# Patient Record
Sex: Female | Born: 1990 | Race: Black or African American | Hispanic: No | Marital: Single
Health system: Midwestern US, Community
[De-identification: ages and names within clinical notes are randomized; demographics above are authoritative.]

---
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
## 2011-08-12 NOTE — ED Notes (Signed)
Bedside report and handoff received from M. Freida Busman, RN

## 2011-08-12 NOTE — ED Notes (Addendum)
Triage Note: Pt with vaginal pain which started about 3 days ago. Pt stated the pain started after sex. Pt with discharge and itching. No pain with urination. Pt stated she also has swelling to the area.

## 2011-08-12 NOTE — ED Provider Notes (Signed)
HPI Comments: 21 y.o.female with history of asthma who presents to the ED with complaints of vaginal pain and discharge beginning 3 days ago and worsening since onset. Pain is constant, aching, pruritic, nonradiating, moderate to severe and not exacerbated with urination. Vagicil used 2-3 times per day for the past 2 days without relief. Patient was diagnosed and treated for chlamydia 2 months ago at [facility]. Denies possibility of recurrence. Denies any HA, fever, chills, cough, congestion, chest pain, dyspnea, abdominal pain, nausea, vomiting, diarrhea, bloody stool, dysuria, hematuria, flank pain, vaginal bleeding, numbness, rash or appetite change. Here with boyfriend. He was not partner 2 mo ago.     Surgical Hx: none  Social Hx: +BC, LMP in past month was normal,     Note written by Shara Blazing, Scribe, as dictated by Danford Bad, MD 12:46 AM          The history is provided by the patient.        Past Medical History   Diagnosis Date   ??? Asthma         History reviewed. No pertinent past surgical history.      History reviewed. No pertinent family history.     History     Social History   ??? Marital Status: SINGLE     Spouse Name: N/A     Number of Children: N/A   ??? Years of Education: N/A     Occupational History   ??? Not on file.     Social History Main Topics   ??? Smoking status: Not on file   ??? Smokeless tobacco: Not on file   ??? Alcohol Use: Yes   ??? Drug Use:    ??? Sexually Active:      Other Topics Concern   ??? Not on file     Social History Narrative   ??? No narrative on file                  ALLERGIES: Amoxicillin      Review of Systems   Constitutional: Negative for fever, chills, diaphoresis and appetite change.   HENT: Negative for congestion and rhinorrhea.    Respiratory: Negative for cough.    Gastrointestinal: Negative for nausea, vomiting, abdominal pain and diarrhea.   Genitourinary: Positive for vaginal discharge and vaginal pain. Negative for dysuria, frequency, flank pain, decreased  urine volume, vaginal bleeding, menstrual problem and pelvic pain.   Skin: Negative for rash.   Neurological: Negative for weakness, light-headedness and headaches.   All other systems reviewed and are negative.        Filed Vitals:    08/12/11 2221   BP: 124/95   Pulse: 90   Temp: 98.2 ??F (36.8 ??C)   Resp: 18   Weight: 51.3 kg (113 lb 1.5 oz)   SpO2: 100%            Physical Exam   Nursing note and vitals reviewed.  Constitutional: She is oriented to person, place, and time. She appears well-developed and well-nourished. No distress.        Pt comfortable in bed   HENT:   Head: Normocephalic and atraumatic.   Mouth/Throat: Oropharynx is clear and moist. No oropharyngeal exudate.   Eyes: Conjunctivae are normal. No scleral icterus.   Neck: Neck supple. No JVD present.   Cardiovascular: Normal rate, regular rhythm, normal heart sounds and intact distal pulses.    Pulmonary/Chest: Effort normal and breath sounds normal. No respiratory  distress. She has no wheezes. She exhibits no tenderness.   Abdominal: Soft. She exhibits no distension. There is no tenderness. There is no rebound and no guarding.   Genitourinary:        + yellowish/green vaginal discharge, cervix slightly erythematous. No CMT. No external lesions.    Musculoskeletal: Normal range of motion.   Lymphadenopathy:     She has no cervical adenopathy.   Neurological: She is alert and oriented to person, place, and time. No cranial nerve deficit. Coordination normal.   Skin: Skin is warm.   Psychiatric: She has a normal mood and affect.        MDM     Differential Diagnosis; Clinical Impression; Plan:     21 yo with + vaginal discharge, 2 mo had + chlamydia- appears c/w same infection again. UA + for signs of UTI vs vaginal discharge in urine. Culture pending. Treated for STD with CTX and azithro. Will give course fo keflex for UTI. F/u with PMD  Amount and/or Complexity of Data Reviewed:   Clinical lab tests:  Ordered and reviewed   Obtain history from  someone other than the patient:  No  Risk of Significant Complications, Morbidity, and/or Mortality:   Presenting problems:  Moderate  Management options:  Moderate  Progress:   Patient progress:  Improved      Procedures

## 2011-08-13 LAB — KOH, OTHER SOURCES: KOH: NONE SEEN

## 2011-08-13 LAB — URINALYSIS W/ REFLEX CULTURE
Bacteria: NEGATIVE /HPF
Bilirubin: NEGATIVE
Glucose: NEGATIVE MG/DL
Ketone: NEGATIVE MG/DL
Nitrites: NEGATIVE
Protein: NEGATIVE MG/DL
Specific gravity: 1.011 (ref 1.003–1.030)
Urobilinogen: 1 EU/DL (ref 0.2–1.0)
pH (UA): 6.5 (ref 5.0–8.0)

## 2011-08-13 LAB — HCG URINE, QL. - POC: Pregnancy test,urine (POC): NEGATIVE

## 2011-08-13 LAB — CHLAMYDIA/GC PCR
Chlamydia amplified: NEGATIVE
N. gonorrhea, amplified: NEGATIVE

## 2011-08-13 LAB — WET PREP
Clue cells: ABSENT
Wet prep: NONE SEEN

## 2011-08-13 MED ORDER — TRIMETHOPRIM-SULFAMETHOXAZOLE 160 MG-800 MG TAB
160-800 mg | ORAL_TABLET | Freq: Two times a day (BID) | ORAL | Status: AC
Start: 2011-08-13 — End: 2011-08-16

## 2011-08-13 MED ADMIN — cefTRIAXone (ROCEPHIN) 250 mg in lidocaine (PF) (XYLOCAINE) 10 mg/mL (1 %) IM injection: INTRAMUSCULAR | @ 06:00:00 | NDC 63323049257

## 2011-08-13 MED ADMIN — ondansetron (ZOFRAN ODT) tablet 4 mg: ORAL | @ 06:00:00 | NDC 68462015740

## 2011-08-13 MED ADMIN — azithromycin (ZITHROMAX) tablet 1,000 mg: ORAL | @ 06:00:00 | NDC 50268009815

## 2011-08-13 MED FILL — CEFTRIAXONE 250 MG SOLUTION FOR INJECTION: 250 mg | INTRAMUSCULAR | Qty: 250

## 2011-08-13 MED FILL — ONDANSETRON 4 MG TAB, RAPID DISSOLVE: 4 mg | ORAL | Qty: 1

## 2011-08-13 MED FILL — AZITHROMYCIN 250 MG TAB: 250 mg | ORAL | Qty: 4

## 2011-08-13 NOTE — ED Notes (Signed)
Pt up to restroom without difficulty

## 2011-08-13 NOTE — ED Notes (Signed)
Pt tolerated pelvic exam without difficulty. Respirations regular. Skin pink, dry and warm. Abdomen soft and non tender. Pt stated she continues with slight vaginal pain at this time. Friend at bedside.

## 2011-08-13 NOTE — ED Notes (Signed)
Pt without swelling or discomfort at injection site. Respirations regular. Skin pink, dry and warm. Abdomen soft and nontender.

## 2011-08-13 NOTE — ED Notes (Signed)
DC instructions given by MD, pt verbalized understanding, no questions or concerns at this time. Pt ambulated out of department without difficulty, no pain noted at this time.

## 2011-08-14 LAB — CULTURE, URINE
Colonies Counted: <1000
Colony Count: <1000
Culture result:: NO GROWTH
Culture: NO GROWTH

## 2017-12-28 ENCOUNTER — Emergency Department (HOSPITAL_BASED_OUTPATIENT_CLINIC_OR_DEPARTMENT_OTHER)
Admission: EM | Admit: 2017-12-28 | Discharge: 2017-12-28 | Disposition: A | Discharge status: Home / Self Care | Payer: No Typology Code available for payment source | Attending: Emergency Medicine | Admitting: Emergency Medicine

## 2017-12-28 ENCOUNTER — Other Ambulatory Visit: Payer: Self-pay

## 2017-12-28 ENCOUNTER — Encounter (HOSPITAL_BASED_OUTPATIENT_CLINIC_OR_DEPARTMENT_OTHER): Payer: Self-pay | Admitting: Emergency Medicine

## 2017-12-28 ENCOUNTER — Emergency Department (HOSPITAL_BASED_OUTPATIENT_CLINIC_OR_DEPARTMENT_OTHER): Payer: No Typology Code available for payment source

## 2017-12-28 DIAGNOSIS — F1210 Cannabis abuse, uncomplicated: Secondary | ICD-10-CM | POA: Diagnosis not present

## 2017-12-28 DIAGNOSIS — R1032 Left lower quadrant pain: Secondary | ICD-10-CM | POA: Diagnosis present

## 2017-12-28 DIAGNOSIS — R110 Nausea: Secondary | ICD-10-CM | POA: Diagnosis not present

## 2017-12-28 DIAGNOSIS — N83201 Unspecified ovarian cyst, right side: Secondary | ICD-10-CM | POA: Diagnosis not present

## 2017-12-28 DIAGNOSIS — N739 Female pelvic inflammatory disease, unspecified: Secondary | ICD-10-CM | POA: Diagnosis not present

## 2017-12-28 LAB — URINALYSIS, ROUTINE W REFLEX MICROSCOPIC
BILIRUBIN URINE: NEGATIVE
Glucose, UA: NEGATIVE mg/dL
KETONES UR: NEGATIVE mg/dL
Nitrite: NEGATIVE
PH: 7 (ref 5.0–8.0)
Protein, ur: NEGATIVE mg/dL
Specific Gravity, Urine: 1.01 (ref 1.005–1.030)

## 2017-12-28 LAB — WET PREP, GENITAL
Clue Cells Wet Prep HPF POC: NONE SEEN
SPERM: NONE SEEN
Trich, Wet Prep: NONE SEEN
YEAST WET PREP: NONE SEEN

## 2017-12-28 LAB — URINALYSIS, MICROSCOPIC (REFLEX): RBC / HPF: NONE SEEN RBC/hpf (ref 0–5)

## 2017-12-28 LAB — PREGNANCY, URINE: PREG TEST UR: NEGATIVE

## 2017-12-28 MED ORDER — LIDOCAINE HCL (PF) 1 % IJ SOLN
INTRAMUSCULAR | Status: AC
  Filled 2017-12-28: qty 5

## 2017-12-28 MED ORDER — DOXYCYCLINE HYCLATE 100 MG PO CAPS: 100.0000 mg | ORAL_CAPSULE | Freq: Two times a day (BID) | ORAL | 0 refills | Status: AC

## 2017-12-28 MED ORDER — MELOXICAM 7.5 MG PO TABS: 7.5000 mg | ORAL_TABLET | Freq: Every day | ORAL | 0 refills | Status: AC

## 2017-12-28 MED ORDER — CEFTRIAXONE SODIUM 250 MG IJ SOLR
250.0000 mg | Freq: Once | INTRAMUSCULAR | Status: AC
  Administered 2017-12-28: 250 mg via INTRAMUSCULAR
  Filled 2017-12-28: qty 250

## 2017-12-28 MED ORDER — DOXYCYCLINE HYCLATE 100 MG PO TABS
100.0000 mg | ORAL_TABLET | Freq: Once | ORAL | Status: AC
  Administered 2017-12-28: 100 mg via ORAL
  Filled 2017-12-28: qty 1

## 2017-12-28 MED ORDER — FLUCONAZOLE 150 MG PO TABS: 150.0000 mg | ORAL_TABLET | Freq: Once | ORAL | 0 refills | Status: AC

## 2017-12-28 MED ORDER — MELOXICAM 7.5 MG PO TABS
7.5000 mg | ORAL_TABLET | Freq: Once | ORAL | Status: AC
  Administered 2017-12-28: 7.5 mg via ORAL
  Filled 2017-12-28: qty 1

## 2017-12-28 NOTE — Discharge Instructions (Signed)
Please read the instructions below. Talk with your Gynecologist about your ultrasound results. Finish your antibiotic as prescribed. You will receive a call from the hospital if your test results come back positive. Avoid sexual activity until you know your test results. If your results come back positive, it is important that you inform all of your sexual partners. Return to the ER for new or worsening symptoms.

## 2017-12-28 NOTE — ED Provider Notes (Addendum)
[facility] EMERGENCY DEPARTMENT Provider Note   CSN: [id number] Arrival date & time: 12/28/17  1606     History   Chief Complaint Chief Complaint  Patient presents with  . Abdominal Pain    HPI Lynn Stanley is a 27 y.o. female with PMHx recurrent BV, presenting to the ED with complaint of intermittent chronic LLQ abdominal pain. Pain has been coming and going for "a while", occurs 1-2 times per month. Pain is described as dull and aching, with intermittent sharp pains. This episode began yesterday. She has treated with ibuprofen and mobic, mobic provided imrovement however she left her Rx at home and lives in [place]. (she is here for work). She has assoc intermittent nausea. Denies vomiting, diarrhea, constipation, fever, urinary sx. States she has issues with chronic BV, has some vaginal discharge, not more than normal. Was recently being treated by her Gyn for prolonged vaginal bleeding after her period. Had an IUD placed and then started taking a "pill" on Wednesday. Bleeding resolved. She went to urgent care prior to arrival who sent her here for further evaluation. No hx abdominal surgery. Not currently sexually active.   The history is provided by the patient.    History reviewed. No pertinent past medical history.  There are no active problems to display for this patient.   History reviewed. No pertinent surgical history.   OB History   None      Home Medications    Prior to Admission medications   Medication Sig Start Date End Date Taking? Authorizing Provider  doxycycline (VIBRAMYCIN) 100 MG capsule Take 1 capsule (100 mg total) by mouth 2 (two) times daily for 14 days. 12/28/17 01/11/18  Quang Thorpe, [place] N, PA-C  fluconazole (DIFLUCAN) 150 MG tablet Take 1 tablet (150 mg total) by mouth once for 1 dose. 12/28/17 12/28/17  Charmion Hapke, [place] N, PA-C  meloxicam (MOBIC) 7.5 MG tablet Take 1 tablet (7.5 mg total) by mouth daily for 10 days. 12/28/17 01/07/18   Tyheim Vanalstyne, [place] N, PA-C    Family History No family history on file.  Social History Social History   Tobacco Use  . Smoking status: Never Smoker  . Smokeless tobacco: Never Used  Substance Use Topics  . Alcohol use: Yes  . Drug use: Yes    Types: Marijuana     Allergies   Amoxicillin   Review of Systems Review of Systems  Constitutional: Negative for fever.  Gastrointestinal: Positive for abdominal pain and nausea. Negative for constipation, diarrhea and vomiting.  Genitourinary: Positive for vaginal discharge. Negative for dysuria, frequency and vaginal bleeding.  All other systems reviewed and are negative.    Physical Exam Updated Vital Signs BP 119/76 (BP Location: Right Arm)   Pulse 71   Temp 98.5 F (36.9 C) (Oral)   Resp 18   Ht 5\' 5"  (1.651 m)   Wt 54.4 kg   SpO2 100%   BMI 19.97 kg/m   Physical Exam  Constitutional: She appears well-developed and well-nourished.  Non-toxic appearance. She does not appear ill. No distress.  HENT:  Head: Normocephalic and atraumatic.  Eyes: Conjunctivae are normal.  Cardiovascular: Normal rate, regular rhythm and normal heart sounds.  Pulmonary/Chest: Effort normal and breath sounds normal. No respiratory distress.  Abdominal: Soft. Bowel sounds are normal. She exhibits no distension and no mass. There is tenderness (left lower quadrant). There is no rebound and no guarding.  Genitourinary: There is no rash or tenderness on the right labia. There is no rash  or tenderness on the left labia. Cervix exhibits motion tenderness and discharge. Right adnexum displays tenderness. Left adnexum displays tenderness. No tenderness in the vagina.  Genitourinary Comments: Exam performed with female RN chaperone present.  Neurological: She is alert.  Skin: Skin is warm.  Psychiatric: She has a normal mood and affect. Her behavior is normal.  Nursing note and vitals reviewed.    ED Treatments / Results  Labs (all labs ordered  are listed, but only abnormal results are displayed) Labs Reviewed  WET PREP, GENITAL - Abnormal; Notable for the following components:      Result Value   WBC, Wet Prep HPF POC MANY (*)    All other components within normal limits  URINALYSIS, ROUTINE W REFLEX MICROSCOPIC - Abnormal; Notable for the following components:   Hgb urine dipstick TRACE (*)    Leukocytes, UA MODERATE (*)    All other components within normal limits  URINALYSIS, MICROSCOPIC (REFLEX) - Abnormal; Notable for the following components:   Bacteria, UA RARE (*)    All other components within normal limits  PREGNANCY, URINE  HIV ANTIBODY (ROUTINE TESTING W REFLEX)  RPR  GC/CHLAMYDIA PROBE AMP ([place]) NOT AT [facility]    EKG None  Radiology US Transvaginal Non-ob  Result Date: 12/28/2017 CLINICAL DATA:  Left lower quadrant abdominal pain. Patient has IUD. EXAM: TRANSABDOMINAL AND TRANSVAGINAL ULTRASOUND OF PELVIS DOPPLER ULTRASOUND OF OVARIES TECHNIQUE: Both transabdominal and transvaginal ultrasound examinations of the pelvis were performed. Transabdominal technique was performed for global imaging of the pelvis including uterus, ovaries, adnexal regions, and pelvic cul-de-sac. It was necessary to proceed with endovaginal exam following the transabdominal exam to visualize the ovaries and uterus. Color and duplex Doppler ultrasound was utilized to evaluate blood flow to the ovaries. COMPARISON:  None. FINDINGS: Uterus Measurements: 9 x 4 x 5.3 cm = volume: 9.9 mL. No fibroids or other mass visualized. Endometrium Thickness: 5 mm.  An IUD is located in the endometrial canal. Right ovary Measurements: 4.8 x 3.5 x 4.7 cm = volume: 41 mL. Contains a 3.8 x 3.3 x 3.5 cm septated hypoechoic mass with septations. Left ovary Measurements: 4.2 x 2.5 x 3.3 cm = volume: 19 mL. Normal appearance/no adnexal mass. Pulsed Doppler evaluation of both ovaries demonstrates normal low-resistance arterial and venous waveforms. Other  findings Trace fluid in the pelvis, likely physiologic. IMPRESSION: 1. A septated hypoechoic mass in the right ovary measures up to 3.8 cm. There may be blood flow within a single thickened septation. This may represent a hemorrhagic cyst but neoplasm is not excluded on this single study. Recommend a follow-up ultrasound in 6-12 weeks to ensure resolution. 2. IUD within the endometrial canal. 3. No other abnormalities. 4. There is trace fluid in the pelvis, likely physiologic. Electronically Signed   By: Gerome Sam III M.D   On: 12/28/2017 19:58   US Pelvis Complete  Result Date: 12/28/2017 CLINICAL DATA:  Left lower quadrant abdominal pain. Patient has IUD. EXAM: TRANSABDOMINAL AND TRANSVAGINAL ULTRASOUND OF PELVIS DOPPLER ULTRASOUND OF OVARIES TECHNIQUE: Both transabdominal and transvaginal ultrasound examinations of the pelvis were performed. Transabdominal technique was performed for global imaging of the pelvis including uterus, ovaries, adnexal regions, and pelvic cul-de-sac. It was necessary to proceed with endovaginal exam following the transabdominal exam to visualize the ovaries and uterus. Color and duplex Doppler ultrasound was utilized to evaluate blood flow to the ovaries. COMPARISON:  None. FINDINGS: Uterus Measurements: 9 x 4 x 5.3 cm = volume: 9.9 mL. No  fibroids or other mass visualized. Endometrium Thickness: 5 mm.  An IUD is located in the endometrial canal. Right ovary Measurements: 4.8 x 3.5 x 4.7 cm = volume: 41 mL. Contains a 3.8 x 3.3 x 3.5 cm septated hypoechoic mass with septations. Left ovary Measurements: 4.2 x 2.5 x 3.3 cm = volume: 19 mL. Normal appearance/no adnexal mass. Pulsed Doppler evaluation of both ovaries demonstrates normal low-resistance arterial and venous waveforms. Other findings Trace fluid in the pelvis, likely physiologic. IMPRESSION: 1. A septated hypoechoic mass in the right ovary measures up to 3.8 cm. There may be blood flow within a single thickened  septation. This may represent a hemorrhagic cyst but neoplasm is not excluded on this single study. Recommend a follow-up ultrasound in 6-12 weeks to ensure resolution. 2. IUD within the endometrial canal. 3. No other abnormalities. 4. There is trace fluid in the pelvis, likely physiologic. Electronically Signed   By: Gerome Sam III M.D   On: 12/28/2017 19:58   [place] Art/ven Flow Abd Pelv Doppler  Result Date: 12/28/2017 CLINICAL DATA:  Left lower quadrant abdominal pain. Patient has IUD. EXAM: TRANSABDOMINAL AND TRANSVAGINAL ULTRASOUND OF PELVIS DOPPLER ULTRASOUND OF OVARIES TECHNIQUE: Both transabdominal and transvaginal ultrasound examinations of the pelvis were performed. Transabdominal technique was performed for global imaging of the pelvis including uterus, ovaries, adnexal regions, and pelvic cul-de-sac. It was necessary to proceed with endovaginal exam following the transabdominal exam to visualize the ovaries and uterus. Color and duplex Doppler ultrasound was utilized to evaluate blood flow to the ovaries. COMPARISON:  None. FINDINGS: Uterus Measurements: 9 x 4 x 5.3 cm = volume: 9.9 mL. No fibroids or other mass visualized. Endometrium Thickness: 5 mm.  An IUD is located in the endometrial canal. Right ovary Measurements: 4.8 x 3.5 x 4.7 cm = volume: 41 mL. Contains a 3.8 x 3.3 x 3.5 cm septated hypoechoic mass with septations. Left ovary Measurements: 4.2 x 2.5 x 3.3 cm = volume: 19 mL. Normal appearance/no adnexal mass. Pulsed Doppler evaluation of both ovaries demonstrates normal low-resistance arterial and venous waveforms. Other findings Trace fluid in the pelvis, likely physiologic. IMPRESSION: 1. A septated hypoechoic mass in the right ovary measures up to 3.8 cm. There may be blood flow within a single thickened septation. This may represent a hemorrhagic cyst but neoplasm is not excluded on this single study. Recommend a follow-up ultrasound in 6-12 weeks to ensure resolution. 2. IUD  within the endometrial canal. 3. No other abnormalities. 4. There is trace fluid in the pelvis, likely physiologic. Electronically Signed   By: Gerome Sam III M.D   On: 12/28/2017 19:58    Procedures Procedures (including critical care time)  Medications Ordered in ED Medications  lidocaine (PF) (XYLOCAINE) 1 % injection (has no administration in time range)  doxycycline (VIBRA-TABS) tablet 100 mg (100 mg Oral Given 12/28/17 2040)  cefTRIAXone (ROCEPHIN) injection 250 mg (250 mg Intramuscular Given 12/28/17 2040)  meloxicam (MOBIC) tablet 7.5 mg (7.5 mg Oral Given 12/28/17 2040)     Initial Impression / Assessment and Plan / ED Course  I have reviewed the triage vital signs and the nursing notes.  Pertinent labs & imaging results that were available during my care of the patient were reviewed by me and considered in my medical decision making (see chart for details).     Pt presenting with intermittent LLQ abd pain for multiple months, with a current episode that began yesterday. Abd is soft with LLQ tenderness. Pelvic with CMT  and b/l adnexal tenderness. There is scant/moderate white discharge. Sent from UC to rule out torsion , however low suspicion for torsion given intermitttent recurring symptoms, symptoms today are not different then previous occurrences. However, given pelvic exam, U/S ordered to rule out other acute pathology.  Wet prep with many WBC. UA is unremarkable, pt without urinary sx. STD cultures sent. preg neg.  U/s w findings consistent with cyst of the left ovary vs possible neoplasm. Discussed findings and recommendation to follow up with Gynecologist when she returns home. Given pelvic exam and wet prep with many WBC, will treat for PID. Pt is afebrile with normal vital signs. Discussed symptomatic management, strict return precautions. Pt is aware she has cultures pending and instructed to avoid intercourse until she knows her results. Safe for discharge.  Pt  discussed with Dr. Anitra Lauth.  Discussed results, findings, treatment and follow up. Patient advised of return precautions. Patient verbalized understanding and agreed with plan.  Final Clinical Impressions(s) / ED Diagnoses   Final diagnoses:  Right ovarian cyst  Pelvic inflammatory disease    ED Discharge Orders         Ordered    doxycycline (VIBRAMYCIN) 100 MG capsule  2 times daily     12/28/17 2026    fluconazole (DIFLUCAN) 150 MG tablet   Once     12/28/17 2026    meloxicam (MOBIC) 7.5 MG tablet  Daily     12/28/17 2026           Reinhart Saulters, [place] N, PA-C 12/28/17 2221    Math Brazie, [place] N, PA-C 12/28/17 2222    Gwyneth Sprout, MD 12/30/17 [phone]

## 2017-12-28 NOTE — ED Triage Notes (Addendum)
L lower abd pain since yesterday with nausea. Sent from UC.

## 2017-12-28 NOTE — ED Notes (Signed)
Patient verbalizes understanding of discharge instructions. Opportunity for questioning and answers were provided. Armband removed by staff, pt discharged from ED.  

## 2017-12-28 NOTE — ED Notes (Signed)
Pt given juice with verbal approval from PA

## 2017-12-29 ENCOUNTER — Telehealth: Payer: Self-pay | Admitting: *Deleted

## 2017-12-29 LAB — GC/CHLAMYDIA PROBE AMP (~~LOC~~) NOT AT ARMC
CHLAMYDIA, DNA PROBE: NEGATIVE
Neisseria Gonorrhea: NEGATIVE

## 2017-12-29 NOTE — Telephone Encounter (Signed)
Pt called from [facility] looking for Rx.  EDCM reviewed chart to find that Rx was sent to [facility] on [address].  Relayed information to pt.

## 2017-12-30 LAB — HIV ANTIBODY (ROUTINE TESTING W REFLEX): HIV Screen 4th Generation wRfx: NONREACTIVE

## 2017-12-30 LAB — RPR: RPR Ser Ql: NONREACTIVE

## 2018-11-19 IMAGING — US US ART/VEN ABD/PELV/SCROTUM DOPPLER LTD
1 series · 13 of 25 positions shown · non-contrast
Comparison: None.

CLINICAL DATA: Left lower quadrant abdominal pain. Patient has IUD.

EXAM:
TRANSABDOMINAL AND TRANSVAGINAL ULTRASOUND OF PELVIS
DOPPLER ULTRASOUND OF OVARIES
TECHNIQUE: Both transabdominal and transvaginal ultrasound examinations of the
pelvis were performed. Transabdominal technique was performed for
global imaging of the pelvis including uterus, ovaries, adnexal
regions, and pelvic cul-de-sac.
It was necessary to proceed with endovaginal exam following the
transabdominal exam to visualize the ovaries and uterus. Color and
duplex Doppler ultrasound was utilized to evaluate blood flow to the
ovaries.

[Series 1: us art/ven abd/pelv/scrotum doppler ltd · 0.20mm/px · 13 of 51 slices shown]
[im 1/51]
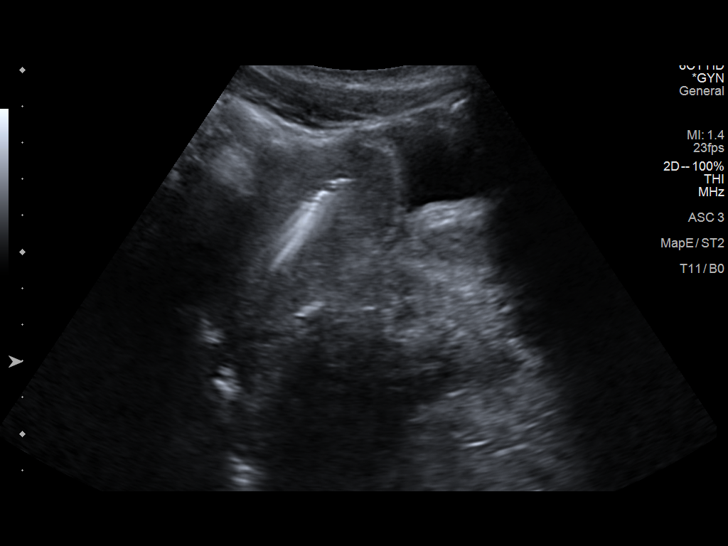
[im 5/51]
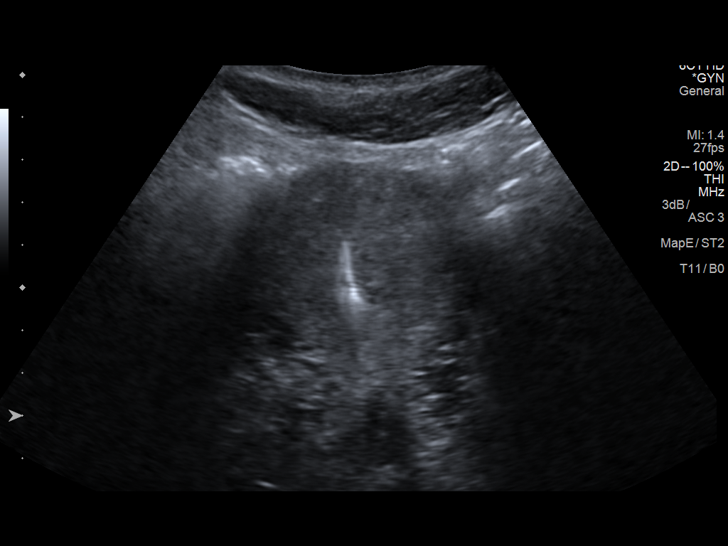
[im 9/51]
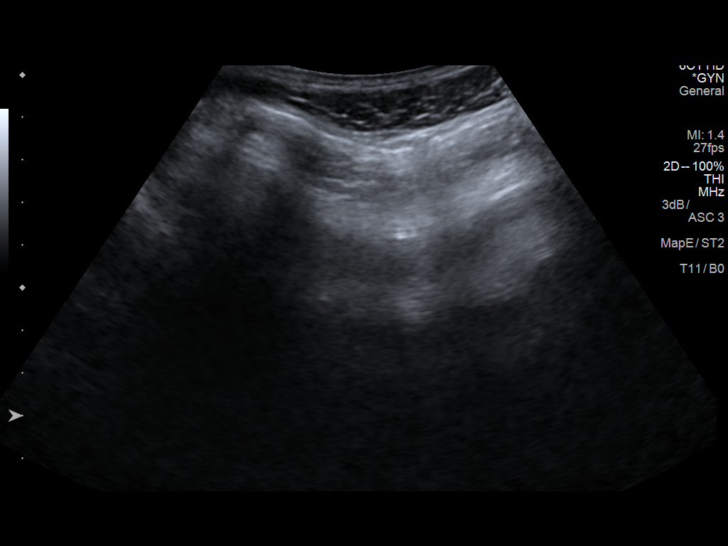
[im 13/51]
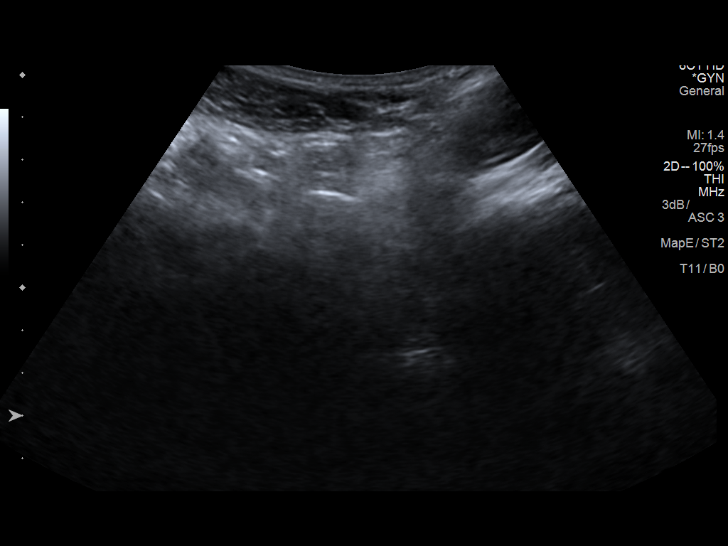
[im 17/51]
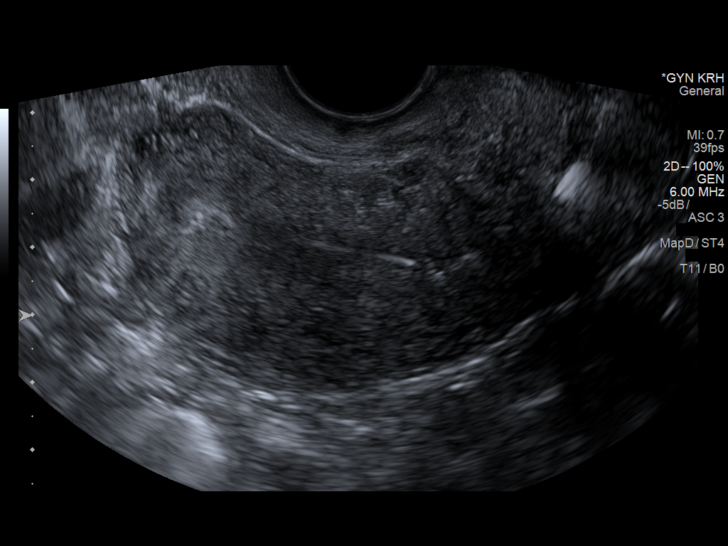
[im 21/51]
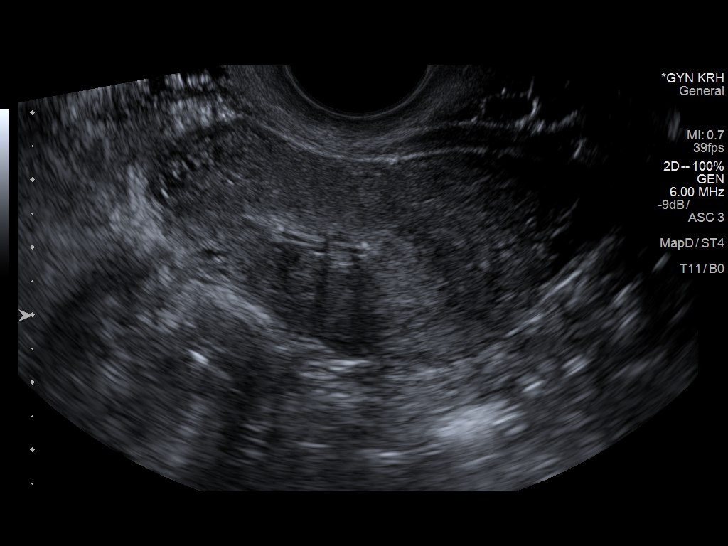
[im 26/51]
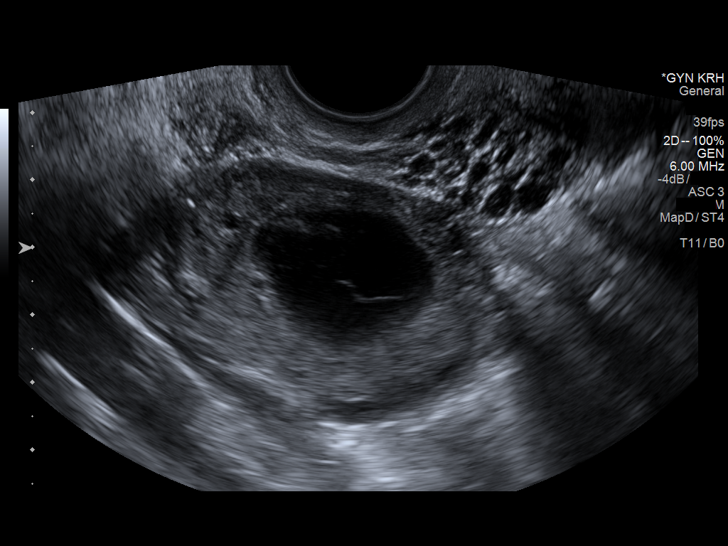
[im 30/51]
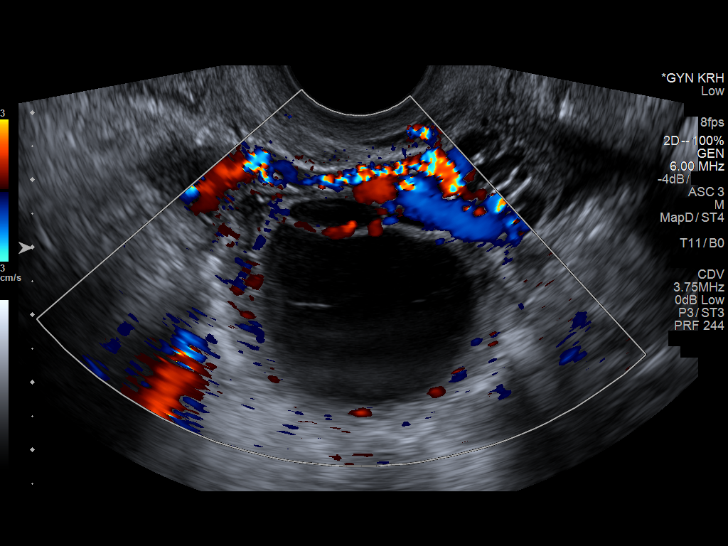
[im 34/51]
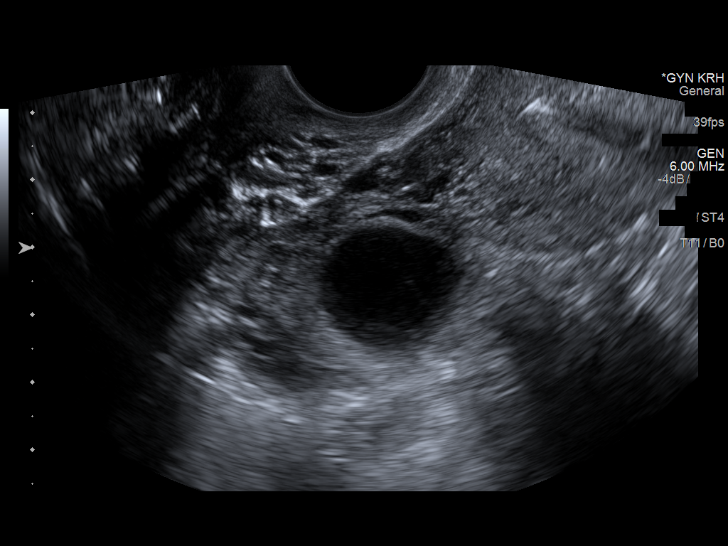
[im 38/51]
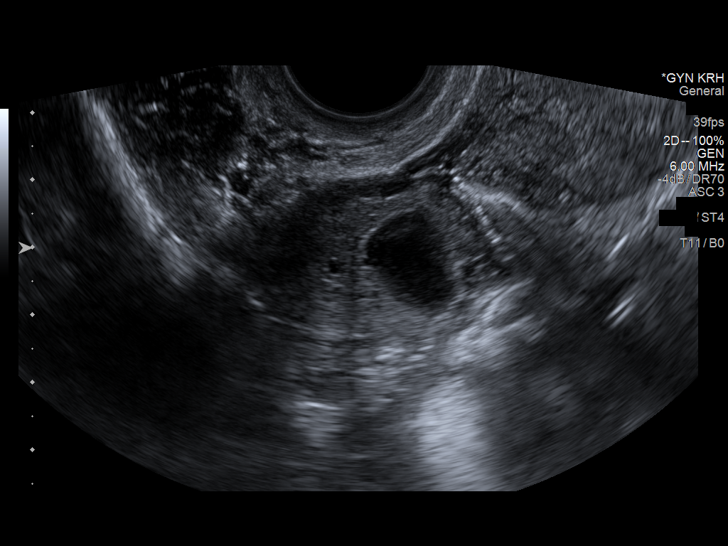
[im 42/51]
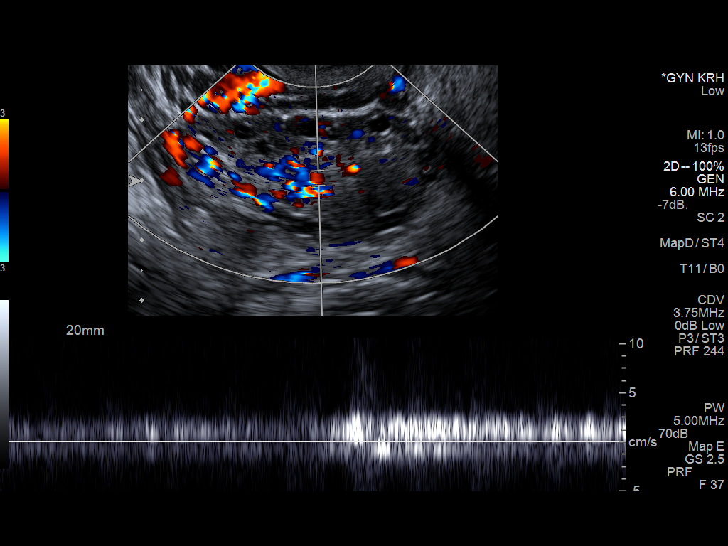
[im 46/51]
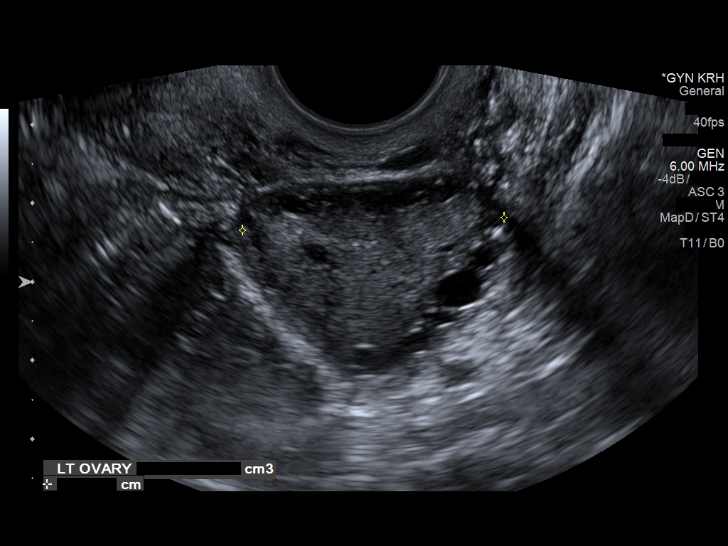
[im 51/51]
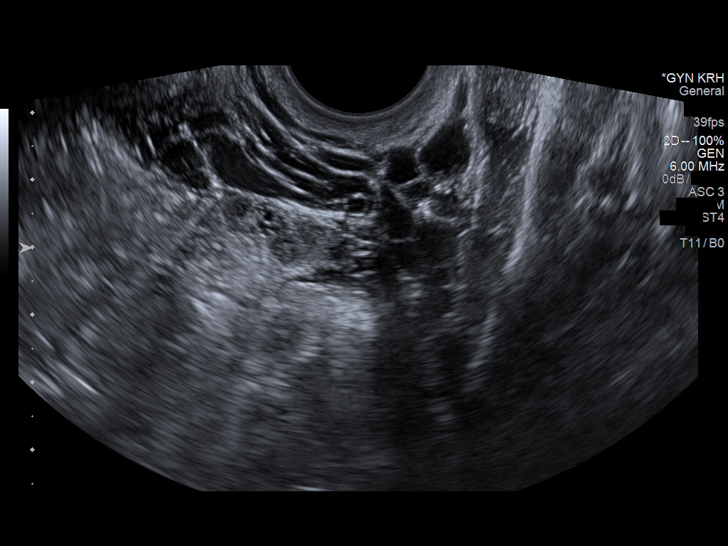

[13 of 25 positions shown; findings below may reference images not displayed]

FINDINGS: Uterus

Measurements: 9 x 4 x 5.3 cm = volume: 9.9 mL. No fibroids or other
mass visualized.

Endometrium

Thickness: 5 mm.  An IUD is located in the endometrial canal.

Right ovary

Measurements: 4.8 x 3.5 x 4.7 cm = volume: 41 mL. Contains a 3.8 x
3.3 x 3.5 cm septated hypoechoic mass with septations.

Left ovary

Measurements: 4.2 x 2.5 x 3.3 cm = volume: 19 mL. Normal
appearance/no adnexal mass.

Pulsed Doppler evaluation of both ovaries demonstrates normal
low-resistance arterial and venous waveforms.

Other findings

Trace fluid in the pelvis, likely physiologic.
IMPRESSION: 1. A septated hypoechoic mass in the right ovary measures up to
cm. There may be blood flow within a single thickened septation.
This may represent a hemorrhagic cyst but neoplasm is not excluded
on this single study. Recommend a follow-up ultrasound in 6-12 weeks
to ensure resolution.
2. IUD within the endometrial canal.
3. No other abnormalities.
4. There is trace fluid in the pelvis, likely physiologic.

## 2019-05-09 ENCOUNTER — Inpatient Hospital Stay
Admit: 2019-05-09 | Discharge: 2019-05-09 | Disposition: A | Discharge status: Home / Self Care | Attending: Emergency Medicine

## 2019-05-09 ENCOUNTER — Emergency Department: Admit: 2019-05-09 | Primary: Family Medicine

## 2019-05-09 DIAGNOSIS — E860 Dehydration: Secondary | ICD-10-CM

## 2019-05-09 LAB — COMPREHENSIVE METABOLIC PANEL
ALT: 15 U/L (ref 12–78)
AST: 16 U/L (ref 15–37)
Albumin/Globulin Ratio: 1.3 (ref 1.1–2.2)
Albumin: 4 g/dL (ref 3.5–5.0)
Alkaline Phosphatase: 75 U/L (ref 45–117)
Anion Gap: 14 mmol/L (ref 5–15)
BUN: 7 MG/DL (ref 6–20)
Bun/Cre Ratio: 8 — ABNORMAL LOW (ref 12–20)
CO2: 20 mmol/L — ABNORMAL LOW (ref 21–32)
Calcium: 9 MG/DL (ref 8.5–10.1)
Chloride: 103 mmol/L (ref 97–108)
Creatinine: 0.86 MG/DL (ref 0.55–1.02)
EGFR IF NonAfrican American: >60 mL/min/{1.73_m2} (ref >60)
GFR African American: >60 mL/min/{1.73_m2} (ref >60)
Globulin: 3.2 g/dL (ref 2.0–4.0)
Glucose: 128 mg/dL — ABNORMAL HIGH (ref 65–100)
Potassium: 3.8 mmol/L (ref 3.5–5.1)
Sodium: 137 mmol/L (ref 136–145)
Total Bilirubin: 0.5 MG/DL (ref 0.2–1.0)
Total Protein: 7.2 g/dL (ref 6.4–8.2)

## 2019-05-09 LAB — CBC WITH AUTO DIFFERENTIAL
Basophils %: 0 % (ref 0–1)
Basophils Absolute: 0.1 10*3/uL (ref 0.0–0.1)
Eosinophils %: 6 % (ref 0–7)
Eosinophils Absolute: 0.9 10*3/uL — ABNORMAL HIGH (ref 0.0–0.4)
Granulocyte Absolute Count: 0.1 10*3/uL — ABNORMAL HIGH (ref 0.00–0.04)
Hematocrit: 40.2 % (ref 35.0–47.0)
Hemoglobin: 13 g/dL (ref 11.5–16.0)
Immature Granulocytes: 1 % — ABNORMAL HIGH (ref 0.0–0.5)
Lymphocytes %: 12 % (ref 12–49)
Lymphocytes Absolute: 1.7 10*3/uL (ref 0.8–3.5)
MCH: 24 PG — ABNORMAL LOW (ref 26.0–34.0)
MCHC: 32.3 g/dL (ref 30.0–36.5)
MCV: 74.3 FL — ABNORMAL LOW (ref 80.0–99.0)
MPV: 13 FL — ABNORMAL HIGH (ref 8.9–12.9)
Monocytes %: 4 % — ABNORMAL LOW (ref 5–13)
Monocytes Absolute: 0.6 10*3/uL (ref 0.0–1.0)
NRBC Absolute: 0 10*3/uL (ref 0.00–0.01)
Neutrophils %: 77 % — ABNORMAL HIGH (ref 32–75)
Neutrophils Absolute: 10.5 10*3/uL — ABNORMAL HIGH (ref 1.8–8.0)
Nucleated RBCs: 0 PER 100 WBC
Platelets: 239 10*3/uL (ref 150–400)
RBC: 5.41 M/uL — ABNORMAL HIGH (ref 3.80–5.20)
RDW: 14.6 % — ABNORMAL HIGH (ref 11.5–14.5)
WBC: 13.7 10*3/uL — ABNORMAL HIGH (ref 3.6–11.0)

## 2019-05-09 LAB — HCG QL SERUM
HCG(Serum) Pregnancy Test: NEGATIVE
HCG, Ql.: NEGATIVE

## 2019-05-09 LAB — LIPASE
Lipase: 77 U/L (ref 73–393)
Lipase: 77 U/L (ref 73–393)

## 2019-05-09 LAB — MAGNESIUM
Magnesium: 2 mg/dL (ref 1.6–2.4)
Magnesium: 2 mg/dL (ref 1.6–2.4)

## 2019-05-09 LAB — CBC WITH AUTOMATED DIFF
ABS. BASOPHILS: 0.1 10*3/uL (ref 0.0–0.1)
ABS. EOSINOPHILS: 0.9 10*3/uL — ABNORMAL HIGH (ref 0.0–0.4)
ABS. IMM. GRANS.: 0.1 10*3/uL — ABNORMAL HIGH (ref 0.00–0.04)
ABS. LYMPHOCYTES: 1.7 10*3/uL (ref 0.8–3.5)
ABS. MONOCYTES: 0.6 10*3/uL (ref 0.0–1.0)
ABS. NEUTROPHILS: 10.5 10*3/uL — ABNORMAL HIGH (ref 1.8–8.0)
ABSOLUTE NRBC: 0 10*3/uL (ref 0.00–0.01)
BASOPHILS: 0 % (ref 0–1)
EOSINOPHILS: 6 % (ref 0–7)
HCT: 40.2 % (ref 35.0–47.0)
HGB: 13 g/dL (ref 11.5–16.0)
IMMATURE GRANULOCYTES: 1 % — ABNORMAL HIGH (ref 0.0–0.5)
LYMPHOCYTES: 12 % (ref 12–49)
MCH: 24 PG — ABNORMAL LOW (ref 26.0–34.0)
MCHC: 32.3 g/dL (ref 30.0–36.5)
MCV: 74.3 FL — ABNORMAL LOW (ref 80.0–99.0)
MONOCYTES: 4 % — ABNORMAL LOW (ref 5–13)
MPV: 13 FL — ABNORMAL HIGH (ref 8.9–12.9)
NEUTROPHILS: 77 % — ABNORMAL HIGH (ref 32–75)
NRBC: 0 PER 100 WBC
PLATELET: 239 10*3/uL (ref 150–400)
RBC: 5.41 M/uL — ABNORMAL HIGH (ref 3.80–5.20)
RDW: 14.6 % — ABNORMAL HIGH (ref 11.5–14.5)
WBC: 13.7 10*3/uL — ABNORMAL HIGH (ref 3.6–11.0)

## 2019-05-09 LAB — METABOLIC PANEL, COMPREHENSIVE
A-G Ratio: 1.3 (ref 1.1–2.2)
ALT (SGPT): 15 U/L (ref 12–78)
AST (SGOT): 16 U/L (ref 15–37)
Albumin: 4 g/dL (ref 3.5–5.0)
Alk. phosphatase: 75 U/L (ref 45–117)
Anion gap: 14 mmol/L (ref 5–15)
BUN/Creatinine ratio: 8 — ABNORMAL LOW (ref 12–20)
BUN: 7 MG/DL (ref 6–20)
Bilirubin, total: 0.5 MG/DL (ref 0.2–1.0)
CO2: 20 mmol/L — ABNORMAL LOW (ref 21–32)
Calcium: 9 MG/DL (ref 8.5–10.1)
Chloride: 103 mmol/L (ref 97–108)
Creatinine: 0.86 MG/DL (ref 0.55–1.02)
GFR est AA: >60 mL/min/{1.73_m2} (ref >60)
GFR est non-AA: >60 mL/min/{1.73_m2} (ref >60)
Globulin: 3.2 g/dL (ref 2.0–4.0)
Glucose: 128 mg/dL — ABNORMAL HIGH (ref 65–100)
Potassium: 3.8 mmol/L (ref 3.5–5.1)
Protein, total: 7.2 g/dL (ref 6.4–8.2)
Sodium: 137 mmol/L (ref 136–145)

## 2019-05-09 LAB — SAMPLES BEING HELD

## 2019-05-09 MED ORDER — SUCRALFATE 1 GRAM TAB
1 gram | ORAL | Status: AC
Start: 2019-05-09 — End: 2019-05-09
  Administered 2019-05-09: 19:00:00 via ORAL

## 2019-05-09 MED ORDER — FENTANYL CITRATE (PF) 50 MCG/ML IJ SOLN
50 mcg/mL | Freq: Once | INTRAMUSCULAR | Status: AC
Start: 2019-05-09 — End: 2019-05-09
  Administered 2019-05-09: 17:00:00 via INTRAVENOUS

## 2019-05-09 MED ORDER — SODIUM CHLORIDE 0.9% BOLUS IV
0.9 % | Freq: Once | INTRAVENOUS | Status: AC
Start: 2019-05-09 — End: 2019-05-09
  Administered 2019-05-09: 17:00:00 via INTRAVENOUS

## 2019-05-09 MED ORDER — ONDANSETRON (PF) 4 MG/2 ML INJECTION
4 mg/2 mL | INTRAMUSCULAR | Status: AC
Start: 2019-05-09 — End: 2019-05-09
  Administered 2019-05-09: 17:00:00 via INTRAVENOUS

## 2019-05-09 MED ORDER — ONDANSETRON 4 MG TAB, RAPID DISSOLVE
4 mg | ORAL_TABLET | Freq: Three times a day (TID) | ORAL | 0 refills | Status: DC | PRN
Start: 2019-05-09 — End: 2019-10-02

## 2019-05-09 MED ORDER — LIDOCAINE 2 % MUCOSAL SOLN
2 % | Freq: Once | Status: AC
Start: 2019-05-09 — End: 2019-05-09
  Administered 2019-05-09: 18:00:00 via ORAL

## 2019-05-09 MED ORDER — HALOPERIDOL LACTATE 5 MG/ML IJ SOLN
5 mg/mL | Freq: Once | INTRAMUSCULAR | Status: AC
Start: 2019-05-09 — End: 2019-05-09
  Administered 2019-05-09: 19:00:00 via INTRAVENOUS

## 2019-05-09 MED FILL — FENTANYL CITRATE (PF) 50 MCG/ML IJ SOLN: 50 mcg/mL | INTRAMUSCULAR | Qty: 2

## 2019-05-09 MED FILL — SUCRALFATE 1 GRAM TAB: 1 gram | ORAL | Qty: 1

## 2019-05-09 MED FILL — ONDANSETRON (PF) 4 MG/2 ML INJECTION: 4 mg/2 mL | INTRAMUSCULAR | Qty: 2

## 2019-05-09 MED FILL — MAG-AL PLUS 200 MG-200 MG-20 MG/5 ML ORAL SUSPENSION: 200-200-20 mg/5 mL | ORAL | Qty: 30

## 2019-05-09 MED FILL — SODIUM CHLORIDE 0.9 % IV: INTRAVENOUS | Qty: 1000

## 2019-05-09 MED FILL — HALOPERIDOL LACTATE 5 MG/ML IJ SOLN: 5 mg/mL | INTRAMUSCULAR | Qty: 1

## 2019-05-09 NOTE — ED Provider Notes (Signed)
29-year-old female with a history of asthma presents with epigastric pain and vomiting.  The patient denies having any symptoms similar to this in the past.  She has a daily, heavy marijuana smoker.  This morning she did take a shot of liquor and subsequently developed her symptoms.  She denies having any surgery on her abdomen in the past.  She has a poor historian and does not provide additional history.            Past Medical History:   Diagnosis Date   ??? Asthma        No past surgical history on file.      No family history on file.    Social History     Socioeconomic History   ??? Marital status: SINGLE     Spouse name: Not on file   ??? Number of children: Not on file   ??? Years of education: Not on file   ??? Highest education level: Not on file   Occupational History   ??? Not on file   Social Needs   ??? Financial resource strain: Not on file   ??? Food insecurity     Worry: Not on file     Inability: Not on file   ??? Transportation needs     Medical: Not on file     Non-medical: Not on file   Tobacco Use   ??? Smoking status: Not on file   Substance and Sexual Activity   ??? Alcohol use: Yes   ??? Drug use: Not on file   ??? Sexual activity: Not on file   Lifestyle   ??? Physical activity     Days per week: Not on file     Minutes per session: Not on file   ??? Stress: Not on file   Relationships   ??? Social connections     Talks on phone: Not on file     Gets together: Not on file     Attends religious service: Not on file     Active member of club or organization: Not on file     Attends meetings of clubs or organizations: Not on file     Relationship status: Not on file   ??? Intimate partner violence     Fear of current or ex partner: Not on file     Emotionally abused: Not on file     Physically abused: Not on file     Forced sexual activity: Not on file   Other Topics Concern   ??? Not on file   Social History Narrative   ??? Not on file         ALLERGIES: Amoxicillin    Review of Systems   Constitutional: Negative for fever.    HENT: Negative for rhinorrhea.    Respiratory: Negative for shortness of breath.    Cardiovascular: Negative for chest pain.   Gastrointestinal: Positive for abdominal pain, nausea and vomiting.   Genitourinary: Negative for dysuria.   Musculoskeletal: Negative for back pain.   Skin: Negative for wound.   Neurological: Positive for headaches.   Psychiatric/Behavioral: Negative for confusion.       Vitals:    05/09/19 1258   BP: (!) 141/85   Pulse: 85   Resp: 16   Temp: 97.7 ??F (36.5 ??C)   SpO2: 100%            Physical Exam  Vitals signs and nursing note reviewed.   Constitutional:         General: She is in acute distress.      Appearance: Normal appearance. She is not ill-appearing, toxic-appearing or diaphoretic.   HENT:      Head: Normocephalic and atraumatic.   Eyes:      Extraocular Movements: Extraocular movements intact.   Neck:      Musculoskeletal: Normal range of motion.   Cardiovascular:      Rate and Rhythm: Normal rate and regular rhythm.      Pulses: Normal pulses.      Heart sounds: Normal heart sounds. No murmur. No friction rub. No gallop.    Pulmonary:      Effort: Pulmonary effort is normal. No respiratory distress.      Breath sounds: Normal breath sounds. No wheezing.   Abdominal:      General: Abdomen is flat. Bowel sounds are normal. There is no distension.      Tenderness: There is abdominal tenderness (Epigastric).   Musculoskeletal: Normal range of motion.   Skin:     General: Skin is warm and dry.   Neurological:      Mental Status: She is alert and oriented to person, place, and time.   Psychiatric:         Mood and Affect: Mood normal.          MDM  Number of Diagnoses or Management Options  Abdominal pain, epigastric  Dehydration  Vomiting, intractability of vomiting not specified, presence of nausea not specified, unspecified vomiting type  Diagnosis management comments: 29 year old female presents with vomiting and epigastric abdominal pain.  The patient is a heavy marijuana smoking  and cyclic vomiting is certainly consideration.  Pancreatitis and gastritis are also considerations.  The patient was given GI cocktail and antiemetics.  She was also given IV fluids.  Abdominal and chest x-ray were obtained to rule out obstruction.  Plain film imaging is unremarkable.  Laboratory studies are unremarkable.  Patient continued to complain of pain and nausea and was given a dose of IV Haldol.  She reports significant improvement after Haldol administration.  After discussion with the patient, I do believe that she is stable for discharge.  My clinical impressions were discussed, we reviewed test results.  My recommendation for marijuana cessation was discussed.  The patient is comfortable and agreeable to plan of care and aware of her return precautions.    EKG shows a normal sinus rhythm at a rate of 69, normal intervals, normal axis, no ischemic changes.         Procedures

## 2019-05-09 NOTE — ED Triage Notes (Signed)
Triage Note: Patient arrives to ER via EMS complaining of abdominal pain, nausea, vomiting and diarrhea. Patient states she woke up at 0200 with pain. Has had 5 episodes of vomiting and 3 of diarrhea. Patient writhing in pain, unable to answer questions due to pain.

## 2019-05-09 NOTE — ED Notes (Signed)
Patient had a bowel movement during triage on the bed. Patient room changed to room 10 so she could shower and have easy access to the bathroom.     Patient assisted to the bathroom with help from RN. Patient showered and provided with a hospital gown. Patient had BM on the toilet. Now back in bed. Warm blankets provided.

## 2019-05-09 NOTE — ED Notes (Signed)
The patient left the Emergency Department ambulatory, alert and oriented and in no acute distress. The patient was encouraged to call or return to the ED for worsening issues or problems and was encouraged to schedule a follow up appointment for continuing care.   ??  The patient verbalized understanding of discharge instructions and prescriptions, all questions were answered. The patient has no further concerns at this time.      Patient waiting on a ride to bring her clothing.

## 2019-05-09 NOTE — ED Notes (Signed)
Patient had a bowel movement during triage on the bed. Patient room changed to room 10 so she could shower and have easy access to the bathroom.     Patient assisted to the bathroom with help from RN. Patient showered and provided with a hospital gown. Patient had BM on the toilet. Now back in bed. Warm blankets provided.

## 2019-05-09 NOTE — ED Notes (Signed)
Triage Note: Patient arrives to ER via EMS complaining of abdominal pain, nausea, vomiting and diarrhea. Patient states she woke up at 0200 with pain. Has had 5 episodes of vomiting and 3 of diarrhea. Patient writhing in pain, unable to answer questions due to pain.

## 2019-05-09 NOTE — ED Provider Notes (Signed)
29 year old female with a history of asthma presents with epigastric pain and vomiting.  The patient denies having any symptoms similar to this in the past.  She has a daily, heavy marijuana smoker.  This morning she did take a shot of liquor and subsequently developed her symptoms.  She denies having any surgery on her abdomen in the past.  She has a poor historian and does not provide additional history.            Past Medical History:   Diagnosis Date   ??? Asthma        No past surgical history on file.      No family history on file.    Social History     Socioeconomic History   ??? Marital status: SINGLE     Spouse name: Not on file   ??? Number of children: Not on file   ??? Years of education: Not on file   ??? Highest education level: Not on file   Occupational History   ??? Not on file   Social Needs   ??? Financial resource strain: Not on file   ??? Food insecurity     Worry: Not on file     Inability: Not on file   ??? Transportation needs     Medical: Not on file     Non-medical: Not on file   Tobacco Use   ??? Smoking status: Not on file   Substance and Sexual Activity   ??? Alcohol use: Yes   ??? Drug use: Not on file   ??? Sexual activity: Not on file   Lifestyle   ??? Physical activity     Days per week: Not on file     Minutes per session: Not on file   ??? Stress: Not on file   Relationships   ??? Social Product manager on phone: Not on file     Gets together: Not on file     Attends religious service: Not on file     Active member of club or organization: Not on file     Attends meetings of clubs or organizations: Not on file     Relationship status: Not on file   ??? Intimate partner violence     Fear of current or ex partner: Not on file     Emotionally abused: Not on file     Physically abused: Not on file     Forced sexual activity: Not on file   Other Topics Concern   ??? Not on file   Social History Narrative   ??? Not on file         ALLERGIES: Amoxicillin    Review of Systems   Constitutional: Negative for fever.    HENT: Negative for rhinorrhea.    Respiratory: Negative for shortness of breath.    Cardiovascular: Negative for chest pain.   Gastrointestinal: Positive for abdominal pain, nausea and vomiting.   Genitourinary: Negative for dysuria.   Musculoskeletal: Negative for back pain.   Skin: Negative for wound.   Neurological: Positive for headaches.   Psychiatric/Behavioral: Negative for confusion.       Vitals:    05/09/19 1258   BP: (!) 141/85   Pulse: 85   Resp: 16   Temp: 97.7 ??F (36.5 ??C)   SpO2: 100%            Physical Exam  Vitals signs and nursing note reviewed.   Constitutional:  General: She is in acute distress.      Appearance: Normal appearance. She is not ill-appearing, toxic-appearing or diaphoretic.   HENT:      Head: Normocephalic and atraumatic.   Eyes:      Extraocular Movements: Extraocular movements intact.   Neck:      Musculoskeletal: Normal range of motion.   Cardiovascular:      Rate and Rhythm: Normal rate and regular rhythm.      Pulses: Normal pulses.      Heart sounds: Normal heart sounds. No murmur. No friction rub. No gallop.    Pulmonary:      Effort: Pulmonary effort is normal. No respiratory distress.      Breath sounds: Normal breath sounds. No wheezing.   Abdominal:      General: Abdomen is flat. Bowel sounds are normal. There is no distension.      Tenderness: There is abdominal tenderness (Epigastric).   Musculoskeletal: Normal range of motion.   Skin:     General: Skin is warm and dry.   Neurological:      Mental Status: She is alert and oriented to person, place, and time.   Psychiatric:         Mood and Affect: Mood normal.          MDM  Number of Diagnoses or Management Options  Abdominal pain, epigastric  Dehydration  Vomiting, intractability of vomiting not specified, presence of nausea not specified, unspecified vomiting type  Diagnosis management comments: 29 year old female presents with vomiting and epigastric abdominal pain.  The patient is a heavy marijuana smoking  and cyclic vomiting is certainly consideration.  Pancreatitis and gastritis are also considerations.  The patient was given GI cocktail and antiemetics.  She was also given IV fluids.  Abdominal and chest x-ray were obtained to rule out obstruction.  Plain film imaging is unremarkable.  Laboratory studies are unremarkable.  Patient continued to complain of pain and nausea and was given a dose of IV Haldol.  She reports significant improvement after Haldol administration.  After discussion with the patient, I do believe that she is stable for discharge.  My clinical impressions were discussed, we reviewed test results.  My recommendation for marijuana cessation was discussed.  The patient is comfortable and agreeable to plan of care and aware of her return precautions.    EKG shows a normal sinus rhythm at a rate of 69, normal intervals, normal axis, no ischemic changes.         Procedures

## 2019-05-09 NOTE — ED Notes (Signed)
 The patient left the Emergency Department ambulatory, alert and oriented and in no acute distress. The patient was encouraged to call or return to the ED for worsening issues or problems and was encouraged to schedule a follow up appointment for continuing care.     The patient verbalized understanding of discharge instructions and prescriptions, all questions were answered. The patient has no further concerns at this time.      Patient waiting on a ride to bring her clothing.

## 2019-05-10 LAB — EKG 12-LEAD
Atrial Rate: 69 {beats}/min
Diagnosis: NORMAL
P Axis: 80 degrees
P-R Interval: 170 ms
Q-T Interval: 386 ms
QRS Duration: 78 ms
QTc Calculation (Bazett): 413 ms
R Axis: 66 degrees
T Axis: 56 degrees
Ventricular Rate: 69 {beats}/min

## 2019-05-10 LAB — EKG, 12 LEAD, INITIAL
Atrial Rate: 69 {beats}/min
Calculated P Axis: 80 degrees
Calculated R Axis: 66 degrees
Calculated T Axis: 56 degrees
Diagnosis: NORMAL
P-R Interval: 170 ms
Q-T Interval: 386 ms
QRS Duration: 78 ms
QTC Calculation (Bezet): 413 ms
Ventricular Rate: 69 {beats}/min

## 2019-07-21 ENCOUNTER — Inpatient Hospital Stay
Admit: 2019-07-21 | Discharge: 2019-07-21 | Disposition: A | Discharge status: Home / Self Care | Attending: Emergency Medicine

## 2019-07-21 ENCOUNTER — Emergency Department: Admit: 2019-07-21 | Primary: Family Medicine

## 2019-07-21 DIAGNOSIS — F12188 Cannabis abuse with other cannabis-induced disorder: Secondary | ICD-10-CM

## 2019-07-21 LAB — LIPASE
Lipase: 119 U/L (ref 73–393)
Lipase: 119 U/L (ref 73–393)

## 2019-07-21 LAB — CBC WITH AUTO DIFFERENTIAL
Basophils %: 1 % (ref 0–1)
Basophils Absolute: 0.1 10*3/uL (ref 0.0–0.1)
Eosinophils %: 11 % — ABNORMAL HIGH (ref 0–7)
Eosinophils Absolute: 1.4 10*3/uL — ABNORMAL HIGH (ref 0.0–0.4)
Granulocyte Absolute Count: 0.1 10*3/uL — ABNORMAL HIGH (ref 0.00–0.04)
Hematocrit: 43 % (ref 35.0–47.0)
Hemoglobin: 13.7 g/dL (ref 11.5–16.0)
Immature Granulocytes: 1 % — ABNORMAL HIGH (ref 0.0–0.5)
Lymphocytes %: 17 % (ref 12–49)
Lymphocytes Absolute: 2.2 10*3/uL (ref 0.8–3.5)
MCH: 24.1 PG — ABNORMAL LOW (ref 26.0–34.0)
MCHC: 31.9 g/dL (ref 30.0–36.5)
MCV: 75.7 FL — ABNORMAL LOW (ref 80.0–99.0)
MPV: ABNORMAL FL (ref 8.9–12.9)
Monocytes %: 5 % (ref 5–13)
Monocytes Absolute: 0.6 10*3/uL (ref 0.0–1.0)
NRBC Absolute: 0 10*3/uL (ref 0.00–0.01)
Neutrophils %: 66 % (ref 32–75)
Neutrophils Absolute: 8.6 10*3/uL — ABNORMAL HIGH (ref 1.8–8.0)
Nucleated RBCs: 0 PER 100 WBC
Platelets: 238 10*3/uL (ref 150–400)
RBC: 5.68 M/uL — ABNORMAL HIGH (ref 3.80–5.20)
RDW: 14.8 % — ABNORMAL HIGH (ref 11.5–14.5)
WBC: 13 10*3/uL — ABNORMAL HIGH (ref 3.6–11.0)

## 2019-07-21 LAB — HCG URINE, QL. - POC
HCG, Pregnancy, Urine, POC: NEGATIVE
Pregnancy test,urine (POC): NEGATIVE

## 2019-07-21 LAB — COMPREHENSIVE METABOLIC PANEL
ALT: 16 U/L (ref 12–78)
AST: 13 U/L — ABNORMAL LOW (ref 15–37)
Albumin/Globulin Ratio: 1.2 (ref 1.1–2.2)
Albumin: 4 g/dL (ref 3.5–5.0)
Alkaline Phosphatase: 92 U/L (ref 45–117)
Anion Gap: 17 mmol/L — ABNORMAL HIGH (ref 5–15)
BUN: 7 MG/DL (ref 6–20)
Bun/Cre Ratio: 7 — ABNORMAL LOW (ref 12–20)
CO2: 20 mmol/L — ABNORMAL LOW (ref 21–32)
Calcium: 9.1 MG/DL (ref 8.5–10.1)
Chloride: 105 mmol/L (ref 97–108)
Creatinine: 0.98 MG/DL (ref 0.55–1.02)
EGFR IF NonAfrican American: >60 mL/min/{1.73_m2} (ref >60)
GFR African American: >60 mL/min/{1.73_m2} (ref >60)
Globulin: 3.3 g/dL (ref 2.0–4.0)
Glucose: 139 mg/dL — ABNORMAL HIGH (ref 65–100)
Potassium: 3.2 mmol/L — ABNORMAL LOW (ref 3.5–5.1)
Sodium: 142 mmol/L (ref 136–145)
Total Bilirubin: 0.3 MG/DL (ref 0.2–1.0)
Total Protein: 7.3 g/dL (ref 6.4–8.2)

## 2019-07-21 LAB — URINALYSIS W/ REFLEX CULTURE
BACTERIA, URINE: NEGATIVE /hpf
Bacteria: NEGATIVE /hpf
Bilirubin, Urine: NEGATIVE
Bilirubin: NEGATIVE
Blood, Urine: NEGATIVE
Blood: NEGATIVE
Glucose, Ur: NEGATIVE mg/dL
Glucose: NEGATIVE mg/dL
Ketone: NEGATIVE mg/dL
Ketones, Urine: NEGATIVE mg/dL
Leukocyte Esterase, Urine: NEGATIVE
Leukocyte Esterase: NEGATIVE
Nitrite, Urine: NEGATIVE
Nitrites: NEGATIVE
Protein, UA: NEGATIVE mg/dL
Protein: NEGATIVE mg/dL
Specific Gravity, UA: 1.015 (ref 1.003–1.030)
Specific gravity: 1.015 (ref 1.003–1.030)
Urobilinogen, UA, POCT: 0.2 EU/dL (ref 0.2–1.0)
Urobilinogen: 0.2 EU/dL (ref 0.2–1.0)
pH (UA): 8.5 — ABNORMAL HIGH (ref 5.0–8.0)
pH, UA: 8.5 — ABNORMAL HIGH (ref 5.0–8.0)

## 2019-07-21 LAB — ETHYL ALCOHOL
ALCOHOL(ETHYL),SERUM: <10 MG/DL (ref <10)
Ethyl Alcohol: <10 MG/DL (ref <10)

## 2019-07-21 LAB — AMYLASE
Amylase: 44 U/L (ref 25–115)
Amylase: 44 U/L (ref 25–115)

## 2019-07-21 LAB — DRUG SCREEN, URINE
AMPHETAMINES: NEGATIVE
Amphetamine Screen, Urine: NEGATIVE
BARBITURATES: NEGATIVE
BENZODIAZEPINES: NEGATIVE
Barbiturate Screen, Urine: NEGATIVE
Benzodiazepine Screen, Urine: NEGATIVE
COCAINE: NEGATIVE
Cocaine Screen Urine: NEGATIVE
METHADONE: NEGATIVE
Methadone Screen, Urine: NEGATIVE
OPIATES: NEGATIVE
Opiate Screen, Urine: NEGATIVE
PCP Screen, Urine: NEGATIVE
PCP(PHENCYCLIDINE): NEGATIVE
THC (TH-CANNABINOL): POSITIVE — AB
THC Screen, Urine: POSITIVE — AB

## 2019-07-21 LAB — METABOLIC PANEL, COMPREHENSIVE
A-G Ratio: 1.2 (ref 1.1–2.2)
ALT (SGPT): 16 U/L (ref 12–78)
AST (SGOT): 13 U/L — ABNORMAL LOW (ref 15–37)
Albumin: 4 g/dL (ref 3.5–5.0)
Alk. phosphatase: 92 U/L (ref 45–117)
Anion gap: 17 mmol/L — ABNORMAL HIGH (ref 5–15)
BUN/Creatinine ratio: 7 — ABNORMAL LOW (ref 12–20)
BUN: 7 MG/DL (ref 6–20)
Bilirubin, total: 0.3 MG/DL (ref 0.2–1.0)
CO2: 20 mmol/L — ABNORMAL LOW (ref 21–32)
Calcium: 9.1 MG/DL (ref 8.5–10.1)
Chloride: 105 mmol/L (ref 97–108)
Creatinine: 0.98 MG/DL (ref 0.55–1.02)
GFR est AA: >60 mL/min/{1.73_m2} (ref >60)
GFR est non-AA: >60 mL/min/{1.73_m2} (ref >60)
Globulin: 3.3 g/dL (ref 2.0–4.0)
Glucose: 139 mg/dL — ABNORMAL HIGH (ref 65–100)
Potassium: 3.2 mmol/L — ABNORMAL LOW (ref 3.5–5.1)
Protein, total: 7.3 g/dL (ref 6.4–8.2)
Sodium: 142 mmol/L (ref 136–145)

## 2019-07-21 LAB — CBC WITH AUTOMATED DIFF
ABS. BASOPHILS: 0.1 10*3/uL (ref 0.0–0.1)
ABS. EOSINOPHILS: 1.4 10*3/uL — ABNORMAL HIGH (ref 0.0–0.4)
ABS. IMM. GRANS.: 0.1 10*3/uL — ABNORMAL HIGH (ref 0.00–0.04)
ABS. LYMPHOCYTES: 2.2 10*3/uL (ref 0.8–3.5)
ABS. MONOCYTES: 0.6 10*3/uL (ref 0.0–1.0)
ABS. NEUTROPHILS: 8.6 10*3/uL — ABNORMAL HIGH (ref 1.8–8.0)
ABSOLUTE NRBC: 0 10*3/uL (ref 0.00–0.01)
BASOPHILS: 1 % (ref 0–1)
EOSINOPHILS: 11 % — ABNORMAL HIGH (ref 0–7)
HCT: 43 % (ref 35.0–47.0)
HGB: 13.7 g/dL (ref 11.5–16.0)
IMMATURE GRANULOCYTES: 1 % — ABNORMAL HIGH (ref 0.0–0.5)
LYMPHOCYTES: 17 % (ref 12–49)
MCH: 24.1 PG — ABNORMAL LOW (ref 26.0–34.0)
MCHC: 31.9 g/dL (ref 30.0–36.5)
MCV: 75.7 FL — ABNORMAL LOW (ref 80.0–99.0)
MONOCYTES: 5 % (ref 5–13)
MPV: ABNORMAL FL (ref 8.9–12.9)
NEUTROPHILS: 66 % (ref 32–75)
NRBC: 0 PER 100 WBC
PLATELET: 238 10*3/uL (ref 150–400)
RBC: 5.68 M/uL — ABNORMAL HIGH (ref 3.80–5.20)
RDW: 14.8 % — ABNORMAL HIGH (ref 11.5–14.5)
WBC: 13 10*3/uL — ABNORMAL HIGH (ref 3.6–11.0)

## 2019-07-21 MED ORDER — DIPHENHYDRAMINE HCL 50 MG/ML IJ SOLN
50 mg/mL | INTRAMUSCULAR | Status: AC
Start: 2019-07-21 — End: 2019-07-21
  Administered 2019-07-21: 09:00:00 via INTRAVENOUS

## 2019-07-21 MED ORDER — PROMETHAZINE 25 MG/ML INJECTION
25 mg/mL | Freq: Once | INTRAMUSCULAR | Status: AC
Start: 2019-07-21 — End: 2019-07-21
  Administered 2019-07-21: 10:00:00 via INTRAVENOUS

## 2019-07-21 MED ORDER — SODIUM CHLORIDE 0.9% BOLUS IV
0.9 % | Freq: Once | INTRAVENOUS | Status: AC
Start: 2019-07-21 — End: 2019-07-21
  Administered 2019-07-21: 09:00:00 via INTRAVENOUS

## 2019-07-21 MED ORDER — SODIUM CHLORIDE 0.9% BOLUS IV
0.9 % | Freq: Once | INTRAVENOUS | Status: AC
Start: 2019-07-21 — End: 2019-07-21
  Administered 2019-07-21: 10:00:00 via INTRAVENOUS

## 2019-07-21 MED ORDER — KETOROLAC TROMETHAMINE 30 MG/ML INJECTION
30 mg/mL (1 mL) | INTRAMUSCULAR | Status: AC
Start: 2019-07-21 — End: 2019-07-21
  Administered 2019-07-21: 10:00:00 via INTRAVENOUS

## 2019-07-21 MED ORDER — PROCHLORPERAZINE EDISYLATE 5 MG/ML INJECTION
5 mg/mL | INTRAMUSCULAR | Status: AC
Start: 2019-07-21 — End: 2019-07-21
  Administered 2019-07-21: 09:00:00 via INTRAVENOUS

## 2019-07-21 MED ORDER — HALOPERIDOL LACTATE 5 MG/ML IJ SOLN
5 mg/mL | Freq: Once | INTRAMUSCULAR | Status: AC
Start: 2019-07-21 — End: 2019-07-21
  Administered 2019-07-21: 09:00:00 via INTRAVENOUS

## 2019-07-21 MED ORDER — ONDANSETRON 4 MG TAB, RAPID DISSOLVE
4 mg | ORAL_TABLET | Freq: Three times a day (TID) | ORAL | 0 refills | Status: DC | PRN
Start: 2019-07-21 — End: 2019-10-02

## 2019-07-21 MED ORDER — IOPAMIDOL 76 % IV SOLN
76 % | Freq: Once | INTRAVENOUS | Status: AC
Start: 2019-07-21 — End: 2019-07-21
  Administered 2019-07-21: 11:00:00 via INTRAVENOUS

## 2019-07-21 MED FILL — HALOPERIDOL LACTATE 5 MG/ML IJ SOLN: 5 mg/mL | INTRAMUSCULAR | Qty: 1

## 2019-07-21 MED FILL — PROCHLORPERAZINE EDISYLATE 5 MG/ML INJECTION: 5 mg/mL | INTRAMUSCULAR | Qty: 2

## 2019-07-21 MED FILL — KETOROLAC TROMETHAMINE 30 MG/ML INJECTION: 30 mg/mL (1 mL) | INTRAMUSCULAR | Qty: 1

## 2019-07-21 MED FILL — PROMETHAZINE 25 MG/ML INJECTION: 25 mg/mL | INTRAMUSCULAR | Qty: 1

## 2019-07-21 MED FILL — ISOVUE-370  76 % INTRAVENOUS SOLUTION: 370 mg iodine /mL (76 %) | INTRAVENOUS | Qty: 100

## 2019-07-21 MED FILL — SODIUM CHLORIDE 0.9 % IV: INTRAVENOUS | Qty: 1000

## 2019-07-21 MED FILL — DIPHENHYDRAMINE HCL 50 MG/ML IJ SOLN: 50 mg/mL | INTRAMUSCULAR | Qty: 1

## 2019-07-21 NOTE — ED Provider Notes (Addendum)
28-year-old female with a past medical history significant for acid reflux and asthma who presents to the ED with a complaint of epigastric pain accompanied by intractable nausea and vomiting x3 days with watery nonbloody diarrhea.  The patient arrived by EMS who state that they were called at the patient's house because of abdominal pain.  She denies any fever chills, neck and back pain, sore throat, cough or congestion, dysuria, vaginal discharge or bleeding, extremity weakness or numbness, sick contact, skin rash, recent travel, substance abuse, prior abdominal surgery or history of the same.           Past Medical History:   Diagnosis Date   ??? Acid reflux    ??? Asthma        History reviewed. No pertinent surgical history.      History reviewed. No pertinent family history.    Social History     Socioeconomic History   ??? Marital status: SINGLE     Spouse name: Not on file   ??? Number of children: Not on file   ??? Years of education: Not on file   ??? Highest education level: Not on file   Occupational History   ??? Not on file   Tobacco Use   ??? Smoking status: Never Smoker   ??? Smokeless tobacco: Never Used   Substance and Sexual Activity   ??? Alcohol use: Yes   ??? Drug use: Yes     Types: Marijuana   ??? Sexual activity: Not on file   Other Topics Concern   ??? Not on file   Social History Narrative   ??? Not on file     Social Determinants of Health     Financial Resource Strain:    ??? Difficulty of Paying Living Expenses:    Food Insecurity:    ??? Worried About Running Out of Food in the Last Year:    ??? Ran Out of Food in the Last Year:    Transportation Needs:    ??? Lack of Transportation (Medical):    ??? Lack of Transportation (Non-Medical):    Physical Activity:    ??? Days of Exercise per Week:    ??? Minutes of Exercise per Session:    Stress:    ??? Feeling of Stress :    Social Connections:    ??? Frequency of Communication with Friends and Family:    ??? Frequency of Social Gatherings with Friends and Family:    ??? Attends  Religious Services:    ??? Active Member of Clubs or Organizations:    ??? Attends Club or Organization Meetings:    ??? Marital Status:    Intimate Partner Violence:    ??? Fear of Current or Ex-Partner:    ??? Emotionally Abused:    ??? Physically Abused:    ??? Sexually Abused:          ALLERGIES: Amoxicillin    Review of Systems   All other systems reviewed and are negative.      Vitals:    07/21/19 0431 07/21/19 0434   BP: 130/81 130/81   Pulse: 92 90   Resp: 16 16   Temp: 97.5 ??F (36.4 ??C)    SpO2: 100% 100%   Weight:  61.2 kg (135 lb)   Height:  5' 5" (1.651 m)            Physical Exam  Vitals and nursing note reviewed. Exam conducted with a chaperone present.          CONSTITUTIONAL: Well-appearing; well-nourished; in no apparent distress; hyperventilating and dry heaving  HEAD: Normocephalic; atraumatic  EYES: PERRL; EOM intact; conjunctiva and sclera are clear bilaterally.  ENT: No rhinorrhea; normal pharynx with no tonsillar hypertrophy; mucous membranes pink/moist, no erythema, no exudate.  NECK: Supple; non-tender; no cervical lymphadenopathy  CARD: Normal S1, S2; no murmurs, rubs, or gallops. Regular rate and rhythm.  RESP: Normal respiratory effort; breath sounds clear and equal bilaterally; no wheezes, rhonchi, or rales.  ABD: Normal bowel sounds; non-distended; non-tender; no palpable organomegaly, no masses, no bruits.  Back Exam: Normal inspection; no vertebral point tenderness, no CVA tenderness. Normal range of motion.  EXT: Normal ROM in all four extremities; non-tender to palpation; no swelling or deformity; distal pulses are normal, no edema.  SKIN: Warm; dry; no rash.  NEURO:Alert and oriented x 3, coherent, NII-XII grossly intact, sensory and motor are non-focal.      MDM  Number of Diagnoses or Management Options  Diagnosis management comments: Assessment: 29 year old female who presents to the ED with nausea, vomiting and diarrhea with currently dry heaving.  She has a fairly benign exam with stable  vital signs.  Any significant discomfort at this time.  Patient on evaluation for electrolyte abnormality or dehydration.    Plan: Lab/IV fluid/antiemetic and analgesia/CT scan of the abdomen and pelvis/p.o. challenge and serial exam/ Monitor and Reevaluate.         Amount and/or Complexity of Data Reviewed  Clinical lab tests: ordered and reviewed  Tests in the radiology section of CPT??: ordered and reviewed  Tests in the medicine section of CPT??: reviewed and ordered  Discussion of test results with the performing providers: yes  Decide to obtain previous medical records or to obtain history from someone other than the patient: yes  Obtain history from someone other than the patient: yes  Review and summarize past medical records: yes  Discuss the patient with other providers: yes  Independent visualization of images, tracings, or specimens: yes    Risk of Complications, Morbidity, and/or Mortality  Presenting problems: moderate  Diagnostic procedures: moderate  Management options: moderate    Patient Progress  Patient progress: stable         Procedures    Progress Note:   Pt has been reexamined by Loni Beckwith, MD. Pt is feeling much better. Symptoms have improved. All available results have been reviewed with pt and any available family. Pt understands sx, dx, and tx in ED. Care plan has been outlined and questions have been answered. Pt is ready to go home. Will send home on acute gastroenteritis and dehydration instruction.. Outpatient referral with PCP as needed. Written by Loni Beckwith, MD,4:51 AM    6:56 AM  Change of shift.  Care of patient signed over to Dr. Lequita Halt.  Bedside handoff complete. Awaiting Lab and CT..     .   Marland Kitchen

## 2019-07-21 NOTE — ED Triage Notes (Signed)
Patient arrives via ems reporting epigastic pain and nausea x1 hour.

## 2019-07-21 NOTE — ED Notes (Signed)
29-year-old female presents as above and received in turnover pending CT scan and reassessment.  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  Plan to discharge home with Zofran, instructions regarding cannabis hyperemesis, follow-up with primary care, return if needed.

## 2019-07-21 NOTE — ED Notes (Signed)
Patient  given copy of dc instructions and  script(s).  Patient verbalized understanding of instructions and script (s).    Patient alert and oriented and in no acute distress.  Patient discharged home ambulatory with family member

## 2019-07-21 NOTE — ED Provider Notes (Signed)
29 year old female with a past medical history significant for acid reflux and asthma who presents to the ED with a complaint of epigastric pain accompanied by intractable nausea and vomiting x3 days with watery nonbloody diarrhea.  The patient arrived by EMS who state that they were called at the patient's house because of abdominal pain.  She denies any fever chills, neck and back pain, sore throat, cough or congestion, dysuria, vaginal discharge or bleeding, extremity weakness or numbness, sick contact, skin rash, recent travel, substance abuse, prior abdominal surgery or history of the same.           Past Medical History:   Diagnosis Date   ??? Acid reflux    ??? Asthma        History reviewed. No pertinent surgical history.      History reviewed. No pertinent family history.    Social History     Socioeconomic History   ??? Marital status: SINGLE     Spouse name: Not on file   ??? Number of children: Not on file   ??? Years of education: Not on file   ??? Highest education level: Not on file   Occupational History   ??? Not on file   Tobacco Use   ??? Smoking status: Never Smoker   ??? Smokeless tobacco: Never Used   Substance and Sexual Activity   ??? Alcohol use: Yes   ??? Drug use: Yes     Types: Marijuana   ??? Sexual activity: Not on file   Other Topics Concern   ??? Not on file   Social History Narrative   ??? Not on file     Social Determinants of Health     Financial Resource Strain:    ??? Difficulty of Paying Living Expenses:    Food Insecurity:    ??? Worried About Charity fundraiser in the Last Year:    ??? Arboriculturist in the Last Year:    Transportation Needs:    ??? Film/video editor (Medical):    ??? Lack of Transportation (Non-Medical):    Physical Activity:    ??? Days of Exercise per Week:    ??? Minutes of Exercise per Session:    Stress:    ??? Feeling of Stress :    Social Connections:    ??? Frequency of Communication with Friends and Family:    ??? Frequency of Social Gatherings with Friends and Family:    ??? Attends  Religious Services:    ??? Marine scientist or Organizations:    ??? Attends Music therapist:    ??? Marital Status:    Intimate Production manager Violence:    ??? Fear of Current or Ex-Partner:    ??? Emotionally Abused:    ??? Physically Abused:    ??? Sexually Abused:          ALLERGIES: Amoxicillin    Review of Systems   All other systems reviewed and are negative.      Vitals:    07/21/19 0431 07/21/19 0434   BP: 130/81 130/81   Pulse: 92 90   Resp: 16 16   Temp: 97.5 ??F (36.4 ??C)    SpO2: 100% 100%   Weight:  61.2 kg (135 lb)   Height:  5\' 5"  (1.651 m)            Physical Exam  Vitals and nursing note reviewed. Exam conducted with a chaperone present.  CONSTITUTIONAL: Well-appearing; well-nourished; in no apparent distress; hyperventilating and dry heaving  HEAD: Normocephalic; atraumatic  EYES: PERRL; EOM intact; conjunctiva and sclera are clear bilaterally.  ENT: No rhinorrhea; normal pharynx with no tonsillar hypertrophy; mucous membranes pink/moist, no erythema, no exudate.  NECK: Supple; non-tender; no cervical lymphadenopathy  CARD: Normal S1, S2; no murmurs, rubs, or gallops. Regular rate and rhythm.  RESP: Normal respiratory effort; breath sounds clear and equal bilaterally; no wheezes, rhonchi, or rales.  ABD: Normal bowel sounds; non-distended; non-tender; no palpable organomegaly, no masses, no bruits.  Back Exam: Normal inspection; no vertebral point tenderness, no CVA tenderness. Normal range of motion.  EXT: Normal ROM in all four extremities; non-tender to palpation; no swelling or deformity; distal pulses are normal, no edema.  SKIN: Warm; dry; no rash.  NEURO:Alert and oriented x 3, coherent, NII-XII grossly intact, sensory and motor are non-focal.      MDM  Number of Diagnoses or Management Options  Diagnosis management comments: Assessment: 29 year old female who presents to the ED with nausea, vomiting and diarrhea with currently dry heaving.  She has a fairly benign exam with stable  vital signs.  Any significant discomfort at this time.  Patient on evaluation for electrolyte abnormality or dehydration.    Plan: Lab/IV fluid/antiemetic and analgesia/CT scan of the abdomen and pelvis/p.o. challenge and serial exam/ Monitor and Reevaluate.         Amount and/or Complexity of Data Reviewed  Clinical lab tests: ordered and reviewed  Tests in the radiology section of CPT??: ordered and reviewed  Tests in the medicine section of CPT??: reviewed and ordered  Discussion of test results with the performing providers: yes  Decide to obtain previous medical records or to obtain history from someone other than the patient: yes  Obtain history from someone other than the patient: yes  Review and summarize past medical records: yes  Discuss the patient with other providers: yes  Independent visualization of images, tracings, or specimens: yes    Risk of Complications, Morbidity, and/or Mortality  Presenting problems: moderate  Diagnostic procedures: moderate  Management options: moderate    Patient Progress  Patient progress: stable         Procedures    Progress Note:   Pt has been reexamined by Loni Beckwith, MD. Pt is feeling much better. Symptoms have improved. All available results have been reviewed with pt and any available family. Pt understands sx, dx, and tx in ED. Care plan has been outlined and questions have been answered. Pt is ready to go home. Will send home on acute gastroenteritis and dehydration instruction.. Outpatient referral with PCP as needed. Written by Loni Beckwith, MD,4:51 AM    6:56 AM  Change of shift.  Care of patient signed over to Dr. Lequita Halt.  Bedside handoff complete. Awaiting Lab and CT..     .   Marland Kitchen

## 2019-07-21 NOTE — ED Notes (Signed)
29 year old female presents as above and received in turnover pending CT scan and reassessment.  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  Plan to discharge home with Zofran, instructions regarding cannabis hyperemesis, follow-up with primary care, return if needed.

## 2019-07-21 NOTE — ED Notes (Signed)
Patient arrives via ems reporting epigastic pain and nausea x1 hour.

## 2019-10-02 ENCOUNTER — Inpatient Hospital Stay
Admit: 2019-10-02 | Discharge: 2019-10-02 | Disposition: A | Discharge status: Home / Self Care | Attending: Emergency Medicine

## 2019-10-02 ENCOUNTER — Emergency Department: Admit: 2019-10-02 | Primary: Family Medicine

## 2019-10-02 DIAGNOSIS — R0789 Other chest pain: Secondary | ICD-10-CM

## 2019-10-02 LAB — CBC WITH AUTO DIFFERENTIAL
Basophils %: 1 % (ref 0–1)
Basophils Absolute: 0.1 10*3/uL (ref 0.0–0.1)
Eosinophils %: 12 % — ABNORMAL HIGH (ref 0–7)
Eosinophils Absolute: 1.1 10*3/uL — ABNORMAL HIGH (ref 0.0–0.4)
Granulocyte Absolute Count: 0 10*3/uL (ref 0.00–0.04)
Hematocrit: 40.5 % (ref 35.0–47.0)
Hemoglobin: 12.6 g/dL (ref 11.5–16.0)
Immature Granulocytes: 0 % (ref 0.0–0.5)
Lymphocytes %: 20 % (ref 12–49)
Lymphocytes Absolute: 1.9 10*3/uL (ref 0.8–3.5)
MCH: 24.1 PG — ABNORMAL LOW (ref 26.0–34.0)
MCHC: 31.1 g/dL (ref 30.0–36.5)
MCV: 77.4 FL — ABNORMAL LOW (ref 80.0–99.0)
Monocytes %: 5 % (ref 5–13)
Monocytes Absolute: 0.5 10*3/uL (ref 0.0–1.0)
NRBC Absolute: 0 10*3/uL (ref 0.00–0.01)
Neutrophils %: 62 % (ref 32–75)
Neutrophils Absolute: 5.9 10*3/uL (ref 1.8–8.0)
Nucleated RBCs: 0 PER 100 WBC
Platelets: 207 10*3/uL (ref 150–400)
RBC: 5.23 M/uL — ABNORMAL HIGH (ref 3.80–5.20)
RDW: 15 % — ABNORMAL HIGH (ref 11.5–14.5)
WBC: 9.5 10*3/uL (ref 3.6–11.0)

## 2019-10-02 LAB — EKG 12-LEAD
Atrial Rate: 74 {beats}/min
Diagnosis: NORMAL
P Axis: 62 degrees
P-R Interval: 126 ms
Q-T Interval: 368 ms
QRS Duration: 76 ms
QTc Calculation (Bazett): 408 ms
R Axis: 59 degrees
T Axis: 40 degrees
Ventricular Rate: 74 {beats}/min

## 2019-10-02 LAB — COMPREHENSIVE METABOLIC PANEL
ALT: 13 U/L (ref 12–78)
AST: 14 U/L — ABNORMAL LOW (ref 15–37)
Albumin/Globulin Ratio: 1.2 (ref 1.1–2.2)
Albumin: 3.9 g/dL (ref 3.5–5.0)
Alkaline Phosphatase: 76 U/L (ref 45–117)
Anion Gap: 12 mmol/L (ref 5–15)
BUN: 12 MG/DL (ref 6–20)
Bun/Cre Ratio: 13 (ref 12–20)
CO2: 27 mmol/L (ref 21–32)
Calcium: 8.7 MG/DL (ref 8.5–10.1)
Chloride: 104 mmol/L (ref 97–108)
Creatinine: 0.89 MG/DL (ref 0.55–1.02)
EGFR IF NonAfrican American: >60 mL/min/{1.73_m2} (ref >60)
GFR African American: >60 mL/min/{1.73_m2} (ref >60)
Globulin: 3.3 g/dL (ref 2.0–4.0)
Glucose: 122 mg/dL — ABNORMAL HIGH (ref 65–100)
Potassium: 4 mmol/L (ref 3.5–5.1)
Sodium: 143 mmol/L (ref 136–145)
Total Bilirubin: 0.4 MG/DL (ref 0.2–1.0)
Total Protein: 7.2 g/dL (ref 6.4–8.2)

## 2019-10-02 LAB — D-DIMER, QUANTITATIVE: D-Dimer, Quant: 0.45 mg/L FEU (ref 0.00–0.65)

## 2019-10-02 LAB — TROPONIN: Troponin I: <0.05 ng/mL (ref <0.05)

## 2019-10-02 LAB — METABOLIC PANEL, COMPREHENSIVE
A-G Ratio: 1.2 (ref 1.1–2.2)
ALT (SGPT): 13 U/L (ref 12–78)
AST (SGOT): 14 U/L — ABNORMAL LOW (ref 15–37)
Albumin: 3.9 g/dL (ref 3.5–5.0)
Alk. phosphatase: 76 U/L (ref 45–117)
Anion gap: 12 mmol/L (ref 5–15)
BUN/Creatinine ratio: 13 (ref 12–20)
BUN: 12 MG/DL (ref 6–20)
Bilirubin, total: 0.4 MG/DL (ref 0.2–1.0)
CO2: 27 mmol/L (ref 21–32)
Calcium: 8.7 MG/DL (ref 8.5–10.1)
Chloride: 104 mmol/L (ref 97–108)
Creatinine: 0.89 MG/DL (ref 0.55–1.02)
GFR est AA: >60 mL/min/{1.73_m2} (ref >60)
GFR est non-AA: >60 mL/min/{1.73_m2} (ref >60)
Globulin: 3.3 g/dL (ref 2.0–4.0)
Glucose: 122 mg/dL — ABNORMAL HIGH (ref 65–100)
Potassium: 4 mmol/L (ref 3.5–5.1)
Protein, total: 7.2 g/dL (ref 6.4–8.2)
Sodium: 143 mmol/L (ref 136–145)

## 2019-10-02 LAB — D DIMER: D-dimer: 0.45 mg/L FEU (ref 0.00–0.65)

## 2019-10-02 LAB — EKG, 12 LEAD, INITIAL
Atrial Rate: 74 {beats}/min
Calculated P Axis: 62 degrees
Calculated R Axis: 59 degrees
Calculated T Axis: 40 degrees
Diagnosis: NORMAL
P-R Interval: 126 ms
Q-T Interval: 368 ms
QRS Duration: 76 ms
QTC Calculation (Bezet): 408 ms
Ventricular Rate: 74 {beats}/min

## 2019-10-02 LAB — CBC WITH AUTOMATED DIFF
ABS. BASOPHILS: 0.1 10*3/uL (ref 0.0–0.1)
ABS. EOSINOPHILS: 1.1 10*3/uL — ABNORMAL HIGH (ref 0.0–0.4)
ABS. IMM. GRANS.: 0 10*3/uL (ref 0.00–0.04)
ABS. LYMPHOCYTES: 1.9 10*3/uL (ref 0.8–3.5)
ABS. MONOCYTES: 0.5 10*3/uL (ref 0.0–1.0)
ABS. NEUTROPHILS: 5.9 10*3/uL (ref 1.8–8.0)
ABSOLUTE NRBC: 0 10*3/uL (ref 0.00–0.01)
BASOPHILS: 1 % (ref 0–1)
EOSINOPHILS: 12 % — ABNORMAL HIGH (ref 0–7)
HCT: 40.5 % (ref 35.0–47.0)
HGB: 12.6 g/dL (ref 11.5–16.0)
IMMATURE GRANULOCYTES: 0 % (ref 0.0–0.5)
LYMPHOCYTES: 20 % (ref 12–49)
MCH: 24.1 PG — ABNORMAL LOW (ref 26.0–34.0)
MCHC: 31.1 g/dL (ref 30.0–36.5)
MCV: 77.4 FL — ABNORMAL LOW (ref 80.0–99.0)
MONOCYTES: 5 % (ref 5–13)
NEUTROPHILS: 62 % (ref 32–75)
NRBC: 0 PER 100 WBC
PLATELET: 207 10*3/uL (ref 150–400)
RBC: 5.23 M/uL — ABNORMAL HIGH (ref 3.80–5.20)
RDW: 15 % — ABNORMAL HIGH (ref 11.5–14.5)
WBC: 9.5 10*3/uL (ref 3.6–11.0)

## 2019-10-02 LAB — SAMPLES BEING HELD: SAMPLES BEING HELD: 1

## 2019-10-02 LAB — TROPONIN I: Troponin-I, Qt.: <0.05 ng/mL (ref <0.05)

## 2019-10-02 MED ORDER — KETOROLAC TROMETHAMINE 30 MG/ML INJECTION
30 mg/mL (1 mL) | INTRAMUSCULAR | Status: AC
Start: 2019-10-02 — End: 2019-10-02
  Administered 2019-10-02: 17:00:00 via INTRAVENOUS

## 2019-10-02 MED ORDER — LIDOCAINE 5 % (700 MG/PATCH) ADHESIVE PATCH
5 % | CUTANEOUS | 0 refills | Status: AC
Start: 2019-10-02 — End: ?

## 2019-10-02 MED ORDER — IBUPROFEN 800 MG TAB
800 mg | ORAL_TABLET | Freq: Three times a day (TID) | ORAL | 0 refills | Status: AC | PRN
Start: 2019-10-02 — End: 2019-10-09

## 2019-10-02 MED FILL — KETOROLAC TROMETHAMINE 30 MG/ML INJECTION: 30 mg/mL (1 mL) | INTRAMUSCULAR | Qty: 1

## 2019-10-02 NOTE — ED Provider Notes (Signed)
HPI     29 year old female without significant past medical history presents the emergency department with right-sided chest pain since yesterday.  She states onset of pain while laughing.  She states it is a dull 6 out of 10 pain that when she moves or takes a deep breath it is an 8 out of 10 pain.  She has tried aspirin diclofenac and 2 muscle relaxers without relief.  She is on Depo.  Denies any travel or recent surgery.  She does smoke occasionally.  She denies any calf pain or swelling or any history of DVT or PE.  She has not had Covid vaccine during this pandemic, has not been vaccinated for COVID-19 and denies any COVID-19 symptoms or exposure.  She denies feeling shortness of breath, nausea, sweating or radiation of pain.    Past Medical History:   Diagnosis Date   ??? Acid reflux    ??? Asthma        No past surgical history on file.      History reviewed. No pertinent family history.    Social History     Socioeconomic History   ??? Marital status: SINGLE     Spouse name: Not on file   ??? Number of children: Not on file   ??? Years of education: Not on file   ??? Highest education level: Not on file   Occupational History   ??? Not on file   Tobacco Use   ??? Smoking status: Never Smoker   ??? Smokeless tobacco: Never Used   Substance and Sexual Activity   ??? Alcohol use: Yes   ??? Drug use: Yes     Types: Marijuana   ??? Sexual activity: Not on file   Other Topics Concern   ??? Not on file   Social History Narrative   ??? Not on file     Social Determinants of Health     Financial Resource Strain:    ??? Difficulty of Paying Living Expenses:    Food Insecurity:    ??? Worried About Programme researcher, broadcasting/film/video in the Last Year:    ??? Barista in the Last Year:    Transportation Needs:    ??? Freight forwarder (Medical):    ??? Lack of Transportation (Non-Medical):    Physical Activity:    ??? Days of Exercise per Week:    ??? Minutes of Exercise per Session:    Stress:    ??? Feeling of Stress :    Social Connections:    ??? Frequency of  Communication with Friends and Family:    ??? Frequency of Social Gatherings with Friends and Family:    ??? Attends Religious Services:    ??? Database administrator or Organizations:    ??? Attends Engineer, structural:    ??? Marital Status:    Intimate Programme researcher, broadcasting/film/video Violence:    ??? Fear of Current or Ex-Partner:    ??? Emotionally Abused:    ??? Physically Abused:    ??? Sexually Abused:          ALLERGIES: Amoxicillin    Review of Systems   Constitutional: Negative for fever.   HENT: Negative for congestion.    Eyes: Negative for visual disturbance.   Respiratory: Negative for cough and shortness of breath.    Cardiovascular: Positive for chest pain. Negative for palpitations and leg swelling.   Gastrointestinal: Negative for abdominal pain, nausea and vomiting.   Endocrine: Negative for polyuria.  Genitourinary: Negative for dysuria and menstrual problem.   Musculoskeletal: Negative for gait problem.   Neurological: Negative for headaches.   Psychiatric/Behavioral: Negative for dysphoric mood.       Vitals:    10/02/19 1214 10/02/19 1300   BP: 128/73 130/80   Pulse: 81 70   Resp: 16 21   Temp: 97.5 ??F (36.4 ??C)    SpO2: 100% 100%   Weight: 60.5 kg (133 lb 6.1 oz)    Height: 5\' 5"  (1.651 m)             Physical Exam  Constitutional:       General: She is not in acute distress.     Appearance: She is well-developed.   HENT:      Head: Normocephalic and atraumatic.      Mouth/Throat:      Pharynx: No oropharyngeal exudate.   Eyes:      General: No scleral icterus.        Right eye: No discharge.         Left eye: No discharge.      Pupils: Pupils are equal, round, and reactive to light.   Neck:      Vascular: No JVD.   Cardiovascular:      Rate and Rhythm: Normal rate and regular rhythm.      Heart sounds: Normal heart sounds. No murmur heard.     Pulmonary:      Effort: Pulmonary effort is normal. No respiratory distress.      Breath sounds: Normal breath sounds. No stridor. No wheezing or rales.   Chest:      Chest wall:  Tenderness present.   Abdominal:      General: Bowel sounds are normal. There is no distension.      Palpations: Abdomen is soft. There is no mass.      Tenderness: There is no abdominal tenderness. There is no guarding or rebound.   Musculoskeletal:         General: Normal range of motion.      Cervical back: Normal range of motion and neck supple.   Skin:     General: Skin is warm and dry.      Capillary Refill: Capillary refill takes less than 2 seconds.      Findings: No rash.   Neurological:      Mental Status: She is oriented to person, place, and time.   Psychiatric:         Behavior: Behavior normal.         Thought Content: Thought content normal.         Judgment: Judgment normal.          MDM       Procedures        ED EKG interpretation:  Rhythm: normal sinus rhythm; and regular . Rate (approx.): 74; Axis: normal; P wave: normal; QRS interval: normal ; ST/T wave: non-specific changes;This EKG was interpreted by , MD,ED Provider.    Reproducible chest wall tenderness with a very reassuring exam and work-up.  Clear chest x-ray, nonischemic EKG, negative troponin and negative D-dimer.  Will treat conservatively with high-dose ibuprofen and Lidoderm patches.  Close follow with primary care and return precautions provided.

## 2019-10-02 NOTE — ED Notes (Signed)
 The patient left the Emergency Department ambulatory, alert and oriented and in no acute distress. The patient was encouraged to call or return to the ED for worsening issues or problems and was encouraged to schedule a follow up appointment for continuing care.      The patient verbalized understanding of discharge instructions and prescriptions, all questions were answered. The patient has no further concerns at this time.

## 2019-10-02 NOTE — ED Notes (Signed)
Triage Note: Patient arrives to ER complaining of right sided chest pain that started yesterday after a fit of laughter. Patient states the pain radiated to her back. Has taken ibuprofen, muscle relaxer and aspirin without relief. Pain is worse when bending forward, lifting or taking a deep breath.
